# Patient Record
Sex: Female | Born: 1965
Health system: Southern US, Community
[De-identification: ages and names within clinical notes are randomized; demographics above are authoritative.]

## PROBLEM LIST (undated history)

## (undated) DIAGNOSIS — I10 Essential (primary) hypertension: Secondary | ICD-10-CM

## (undated) DIAGNOSIS — G43909 Migraine, unspecified, not intractable, without status migrainosus: Secondary | ICD-10-CM

## (undated) DIAGNOSIS — R011 Cardiac murmur, unspecified: Secondary | ICD-10-CM

## (undated) DIAGNOSIS — D649 Anemia, unspecified: Secondary | ICD-10-CM

## (undated) DIAGNOSIS — R51 Headache: Secondary | ICD-10-CM

## (undated) DIAGNOSIS — M1990 Unspecified osteoarthritis, unspecified site: Secondary | ICD-10-CM

## (undated) DIAGNOSIS — J45909 Unspecified asthma, uncomplicated: Secondary | ICD-10-CM

## (undated) DIAGNOSIS — T7840XA Allergy, unspecified, initial encounter: Secondary | ICD-10-CM

## (undated) HISTORY — DX: Headache: R51

## (undated) HISTORY — DX: Anemia, unspecified: D64.9

## (undated) HISTORY — DX: Unspecified osteoarthritis, unspecified site: M19.90

## (undated) HISTORY — DX: Allergy, unspecified, initial encounter: T78.40XA

## (undated) HISTORY — DX: Unspecified asthma, uncomplicated: J45.909

## (undated) HISTORY — DX: Essential (primary) hypertension: I10

## (undated) HISTORY — DX: Migraine, unspecified, not intractable, without status migrainosus: G43.909

## (undated) HISTORY — DX: Cardiac murmur, unspecified: R01.1

---

## 2001-11-10 ENCOUNTER — Encounter: Payer: Self-pay | Admitting: Family Medicine

## 2005-10-31 ENCOUNTER — Encounter: Payer: Self-pay | Admitting: Family Medicine

## 2007-06-19 ENCOUNTER — Ambulatory Visit: Payer: Self-pay | Admitting: Family Medicine

## 2007-06-19 DIAGNOSIS — G43009 Migraine without aura, not intractable, without status migrainosus: Secondary | ICD-10-CM | POA: Insufficient documentation

## 2007-06-19 DIAGNOSIS — R011 Cardiac murmur, unspecified: Secondary | ICD-10-CM

## 2007-07-24 ENCOUNTER — Encounter: Admission: RE | Admit: 2007-07-24 | Discharge: 2007-07-24 | Payer: Self-pay | Admitting: Family Medicine

## 2007-08-13 ENCOUNTER — Ambulatory Visit: Payer: Self-pay | Admitting: Family Medicine

## 2007-08-13 LAB — CONVERTED CEMR LAB
ALT: 16 units/L (ref 0–35)
AST: 17 units/L (ref 0–37)
Basophils Relative: 0.7 % (ref 0.0–1.0)
Bilirubin, Direct: 0.2 mg/dL (ref 0.0–0.3)
CO2: 28 meq/L (ref 19–32)
Calcium: 9 mg/dL (ref 8.4–10.5)
Chloride: 107 meq/L (ref 96–112)
Eosinophils Absolute: 0.2 10*3/uL (ref 0.0–0.6)
Eosinophils Relative: 3.6 % (ref 0.0–5.0)
GFR calc Af Amer: 89 mL/min
Glucose, Bld: 96 mg/dL (ref 70–99)
Glucose, Urine, Semiquant: NEGATIVE
HCT: 34.2 % — ABNORMAL LOW (ref 36.0–46.0)
Lymphocytes Relative: 51.5 % — ABNORMAL HIGH (ref 12.0–46.0)
MCV: 90.9 fL (ref 78.0–100.0)
Neutro Abs: 1.9 10*3/uL (ref 1.4–7.7)
Neutrophils Relative %: 35.5 % — ABNORMAL LOW (ref 43.0–77.0)
Nitrite: NEGATIVE
Platelets: 309 10*3/uL (ref 150–400)
Sodium: 139 meq/L (ref 135–145)
Specific Gravity, Urine: 1.02
TSH: 1.01 microintl units/mL (ref 0.35–5.50)
Total Protein: 6.2 g/dL (ref 6.0–8.3)
Triglycerides: 35 mg/dL (ref 0–149)
VLDL: 7 mg/dL (ref 0–40)
WBC Urine, dipstick: NEGATIVE
WBC: 5.3 10*3/uL (ref 4.5–10.5)
pH: 8

## 2007-08-24 ENCOUNTER — Encounter: Payer: Self-pay | Admitting: Family Medicine

## 2007-08-28 ENCOUNTER — Ambulatory Visit: Payer: Self-pay | Admitting: Family Medicine

## 2007-08-29 DIAGNOSIS — R51 Headache: Secondary | ICD-10-CM

## 2007-08-29 DIAGNOSIS — R519 Headache, unspecified: Secondary | ICD-10-CM | POA: Insufficient documentation

## 2007-09-14 ENCOUNTER — Other Ambulatory Visit: Admission: RE | Admit: 2007-09-14 | Discharge: 2007-09-14 | Payer: Self-pay | Admitting: Family Medicine

## 2007-09-14 ENCOUNTER — Ambulatory Visit: Payer: Self-pay | Admitting: Family Medicine

## 2007-09-14 ENCOUNTER — Encounter: Payer: Self-pay | Admitting: Family Medicine

## 2007-09-30 ENCOUNTER — Telehealth: Payer: Self-pay | Admitting: Family Medicine

## 2007-09-30 ENCOUNTER — Ambulatory Visit: Payer: Self-pay | Admitting: Internal Medicine

## 2007-12-31 ENCOUNTER — Ambulatory Visit: Payer: Self-pay | Admitting: Family Medicine

## 2007-12-31 LAB — CONVERTED CEMR LAB
Basophils Absolute: 0.1 10*3/uL (ref 0.0–0.1)
Basophils Relative: 1 % (ref 0.0–1.0)
MCHC: 33.4 g/dL (ref 30.0–36.0)
Monocytes Relative: 7.5 % (ref 3.0–11.0)
RBC: 3.91 M/uL (ref 3.87–5.11)
RDW: 12.1 % (ref 11.5–14.6)

## 2008-07-25 ENCOUNTER — Encounter: Admission: RE | Admit: 2008-07-25 | Discharge: 2008-07-25 | Payer: Self-pay | Admitting: Family Medicine

## 2008-08-08 ENCOUNTER — Ambulatory Visit: Payer: Self-pay | Admitting: Family Medicine

## 2008-08-08 LAB — CONVERTED CEMR LAB
AST: 17 units/L (ref 0–37)
Basophils Absolute: 0 10*3/uL (ref 0.0–0.1)
Basophils Relative: 0.6 % (ref 0.0–3.0)
Bilirubin, Direct: 0.2 mg/dL (ref 0.0–0.3)
Chloride: 104 meq/L (ref 96–112)
Cholesterol: 188 mg/dL (ref 0–200)
Creatinine, Ser: 0.9 mg/dL (ref 0.4–1.2)
Eosinophils Absolute: 0.3 10*3/uL (ref 0.0–0.7)
GFR calc non Af Amer: 73 mL/min
HDL: 61.2 mg/dL (ref >39.0)
LDL Cholesterol: 117 mg/dL — ABNORMAL HIGH (ref 0–99)
MCHC: 34.1 g/dL (ref 30.0–36.0)
MCV: 94.1 fL (ref 78.0–100.0)
Neutrophils Relative %: 35.7 % — ABNORMAL LOW (ref 43.0–77.0)
Platelets: 294 10*3/uL (ref 150–400)
RBC: 4.04 M/uL (ref 3.87–5.11)
RDW: 12.3 % (ref 11.5–14.6)
Sodium: 142 meq/L (ref 135–145)
TSH: 2.11 microintl units/mL (ref 0.35–5.50)
Total Bilirubin: 1.6 mg/dL — ABNORMAL HIGH (ref 0.3–1.2)
Triglycerides: 51 mg/dL (ref 0–149)
VLDL: 10 mg/dL (ref 0–40)

## 2008-08-23 ENCOUNTER — Telehealth: Payer: Self-pay | Admitting: Family Medicine

## 2008-08-23 ENCOUNTER — Other Ambulatory Visit: Admission: RE | Admit: 2008-08-23 | Discharge: 2008-08-23 | Payer: Self-pay | Admitting: Family Medicine

## 2008-08-23 ENCOUNTER — Encounter: Payer: Self-pay | Admitting: Family Medicine

## 2008-08-23 ENCOUNTER — Ambulatory Visit: Payer: Self-pay | Admitting: Family Medicine

## 2009-07-31 ENCOUNTER — Encounter: Admission: RE | Admit: 2009-07-31 | Discharge: 2009-07-31 | Payer: Self-pay | Admitting: Family Medicine

## 2009-08-14 ENCOUNTER — Ambulatory Visit: Payer: Self-pay | Admitting: Family Medicine

## 2009-08-14 LAB — CONVERTED CEMR LAB
Albumin: 3.9 g/dL (ref 3.5–5.2)
Alkaline Phosphatase: 39 units/L (ref 39–117)
Basophils Absolute: 0 10*3/uL (ref 0.0–0.1)
Bilirubin, Direct: 0.1 mg/dL (ref 0.0–0.3)
CO2: 28 meq/L (ref 19–32)
Calcium: 8.7 mg/dL (ref 8.4–10.5)
Creatinine, Ser: 0.8 mg/dL (ref 0.4–1.2)
Eosinophils Absolute: 0.2 10*3/uL (ref 0.0–0.7)
Glucose, Bld: 91 mg/dL (ref 70–99)
Glucose, Urine, Semiquant: NEGATIVE
HDL: 62.4 mg/dL (ref >39.00)
Ketones, urine, test strip: NEGATIVE
Lymphocytes Relative: 54.3 % — ABNORMAL HIGH (ref 12.0–46.0)
MCHC: 34.2 g/dL (ref 30.0–36.0)
Neutro Abs: 1.9 10*3/uL (ref 1.4–7.7)
Neutrophils Relative %: 35.3 % — ABNORMAL LOW (ref 43.0–77.0)
Nitrite: NEGATIVE
RDW: 12.4 % (ref 11.5–14.6)
Specific Gravity, Urine: 1.015
Triglycerides: 39 mg/dL (ref 0.0–149.0)
pH: 7.5

## 2009-09-04 ENCOUNTER — Encounter: Payer: Self-pay | Admitting: Family Medicine

## 2009-09-04 ENCOUNTER — Ambulatory Visit: Payer: Self-pay | Admitting: Family Medicine

## 2009-09-04 ENCOUNTER — Other Ambulatory Visit: Admission: RE | Admit: 2009-09-04 | Discharge: 2009-09-04 | Payer: Self-pay | Admitting: Family Medicine

## 2010-08-20 ENCOUNTER — Ambulatory Visit: Payer: Self-pay | Admitting: Family Medicine

## 2010-08-20 ENCOUNTER — Encounter: Admission: RE | Admit: 2010-08-20 | Discharge: 2010-08-20 | Payer: Self-pay | Admitting: Family Medicine

## 2010-08-20 LAB — CONVERTED CEMR LAB
BUN: 14 mg/dL (ref 6–23)
Basophils Absolute: 0.1 10*3/uL (ref 0.0–0.1)
Bilirubin Urine: NEGATIVE
Bilirubin, Direct: 0.1 mg/dL (ref 0.0–0.3)
CO2: 29 meq/L (ref 19–32)
Chloride: 106 meq/L (ref 96–112)
Cholesterol: 172 mg/dL (ref 0–200)
Creatinine, Ser: 0.8 mg/dL (ref 0.4–1.2)
Eosinophils Absolute: 0.2 10*3/uL (ref 0.0–0.7)
Glucose, Bld: 98 mg/dL (ref 70–99)
Ketones, urine, test strip: NEGATIVE
LDL Cholesterol: 102 mg/dL — ABNORMAL HIGH (ref 0–99)
Lymphs Abs: 2.8 10*3/uL (ref 0.7–4.0)
MCHC: 33.8 g/dL (ref 30.0–36.0)
MCV: 94.8 fL (ref 78.0–100.0)
Monocytes Absolute: 0.4 10*3/uL (ref 0.1–1.0)
Neutrophils Relative %: 32.5 % — ABNORMAL LOW (ref 43.0–77.0)
Platelets: 281 10*3/uL (ref 150.0–400.0)
RDW: 12.8 % (ref 11.5–14.6)
TSH: 1.65 microintl units/mL (ref 0.35–5.50)
Total Bilirubin: 0.8 mg/dL (ref 0.3–1.2)
Triglycerides: 37 mg/dL (ref 0.0–149.0)
Urobilinogen, UA: 0.2
WBC: 5.1 10*3/uL (ref 4.5–10.5)
pH: 6.5

## 2010-09-21 ENCOUNTER — Ambulatory Visit: Payer: Self-pay | Admitting: Family Medicine

## 2010-09-21 ENCOUNTER — Other Ambulatory Visit: Admission: RE | Admit: 2010-09-21 | Discharge: 2010-09-21 | Payer: Self-pay | Admitting: Family Medicine

## 2010-09-21 DIAGNOSIS — R319 Hematuria, unspecified: Secondary | ICD-10-CM

## 2010-09-21 LAB — CONVERTED CEMR LAB: Pap Smear: NEGATIVE

## 2010-10-23 ENCOUNTER — Ambulatory Visit: Payer: Self-pay | Admitting: Family Medicine

## 2010-10-23 ENCOUNTER — Telehealth: Payer: Self-pay | Admitting: Family Medicine

## 2010-10-23 LAB — CONVERTED CEMR LAB
Glucose, Urine, Semiquant: NEGATIVE
Ketones, urine, test strip: NEGATIVE
Specific Gravity, Urine: 1.01
pH: 7

## 2010-12-05 NOTE — Assessment & Plan Note (Signed)
Summary: cpx/pap/njr pt rsc/njr rsc bmp/njr ok per doc/njr   Vital Signs:  Patient profile:   45 year old female Menstrual status:  regular LMP:     09/05/2010 Height:      63 inches Weight:      182 pounds BMI:     32.36 Temp:     99.1 degrees F oral BP sitting:   120 / 90  (left arm) Cuff size:   regular  Vitals Entered By: Kern Reap CMA Duncan Dull) (September 21, 2010 10:07 AM) CC: cpx Is Patient Diabetic? No LMP (date): 09/05/2010     Enter LMP: 09/05/2010 Last PAP Result NEGATIVE FOR INTRAEPITHELIAL LESIONS OR MALIGNANCY.   CC:  cpx.  History of Present Illness: Tiffany Beltran is a 45 year old single female, nonsmoker, who comes in today for general physical examination  She has a history of underlying migraine headaches for which she uses Maxalt p.r.n. as time goes on.  The headaches become less frequent and less severe.  She also uses albuterol one puff or two puffs prior to exercise because of a history of exercise-induced asthma.  Periods regular and heavy currently not sexually active, some soreness in the right upper quadrant, sharp pain it hurts when she twists or moves x 3 days.  Otherwise well.  She takes an 81-mg baby aspirin daily.  She gets routine eye care, dental care, BSE monthly, annual mammography, history of fibrocystic changes in both breasts.  She declines a flu shot tetanus booster 2005  Allergies (verified): No Known Drug Allergies  Past History:  Past medical, surgical, family and social histories (including risk factors) reviewed, and no changes noted (except as noted below).  Past Medical History: Reviewed history from 08/23/2008 and no changes required. Headache MR and AR documented by echocardiogram.  Just barely audible murmurs.  No changes since previous exam.  Two years ago  Family History: Reviewed history from 08/23/2008 and no changes required. 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 No apparent alopecia or balding. Eyes:  No corneal or conjunctival inflammation noted. EOMI. Perrla. Funduscopic exam benign, without hemorrhages, exudates or papilledema. Vision grossly normal. Ears:  External ear exam shows no significant lesions or deformities.  Otoscopic examination reveals clear canals, tympanic membranes are intact bilaterally without bulging, retraction, inflammation or discharge. Hearing is grossly normal bilaterally. Nose:  External nasal examination shows no deformity or inflammation. Nasal mucosa are pink and moist without lesions or exudates. Mouth:  Oral mucosa and oropharynx without lesions or exudates.  Teeth in good repair. Neck:  No deformities, masses, or tenderness noted. Chest Wall:  No deformities, masses, or tenderness noted. Breasts:  No mass, nodules, thickening, tenderness, bulging, retraction, inflamation, nipple discharge or skin changes noted.   Lungs:  Normal respiratory effort, chest expands symmetrically. Lungs are clear to auscultation, no crackles or wheezes. Heart:  Normal rate and regular rhythm. S1 and S2 normal without gallop, murmur, click, rub or other extra sounds. Abdomen:  Bowel sounds positive,abdomen soft and non-tender without masses, organomegaly or hernias noted. Rectal:  No external abnormalities noted. Normal sphincter tone. No rectal masses or tenderness. Genitalia:  Pelvic Exam:        External:  normal female genitalia without lesions or masses        Vagina: normal without lesions or masses        Cervix: normal without lesions or masses        Adnexa: normal bimanual  exam without masses or fullness        Uterus: normal by palpation        Pap smear: performed Msk:  No deformity or scoliosis noted of thoracic or lumbar spine.   Pulses:  R and L carotid,radial,femoral,dorsalis pedis and posterior tibial pulses are full and equal bilaterally Extremities:  No clubbing, cyanosis, edema, or deformity noted with normal full range of motion of all joints.   Neurologic:  No cranial nerve deficits noted. Station and gait are normal. Plantar reflexes are down-going bilaterally. DTRs are symmetrical throughout. Sensory, motor and coordinative functions appear intact. Skin:  Intact without suspicious lesions or rashes Cervical Nodes:  No lymphadenopathy noted Axillary Nodes:  No palpable lymphadenopathy Inguinal Nodes:  No significant adenopathy Psych:  Cognition and judgment appear intact. Alert and cooperative with normal attention span and concentration. No apparent delusions, illusions, hallucinations   Impression & Recommendations:  Problem # 1:  HEALTH SCREENING (ICD-V70.0) Assessment Unchanged  Orders: Prescription Created Electronically [phone])  Problem # 2:  HEADACHE (ICD-784.0) Assessment: Improved  Her updated medication list for this problem includes:    Maxalt-mlt 5 Mg Tbdp (Rizatriptan benzoate) ..... One stat for migraine    Aspirin 81 Mg Tabs (Aspirin) .Marland Kitchen... Take one tab by mouth once daily  Problem # 3:  ASTHMA NOS W/O STATUS ASTHMATICUS (ICD-493.90) Assessment: Improved  Her updated medication list for this problem includes:    Ventolin Hfa 108 (90 Base) Mcg/act Aers (Albuterol sulfate) .Marland Kitchen... 2 puffs three times a day prn  Orders: Prescription Created Electronically [phone])  Complete Medication List: 1)  Maxalt-mlt 5 Mg Tbdp (Rizatriptan benzoate) .... One stat for migraine 2)  Ventolin Hfa 108 (90 Base) Mcg/act Aers (Albuterol sulfate) .... 2 puffs three times a day prn 3)  Aspirin 81 Mg Tabs (Aspirin) .... Take one tab by mouth  once daily 4)  Daily Multiple Vitamins Tabs (Multiple vitamin) .... Once daily 5)  Septra Ds 800-160 Mg Tabs (Sulfamethoxazole-trimethoprim) .... Take 1 tablet by mouth two times a day  Other Orders: UA Dipstick w/o Micro (manual) (09811)  Patient Instructions: 1)  Please schedule a follow-up appointment in 1 year. 2)  It is important that you exercise regularly at least 20 minutes 5 times a week. If you develop chest pain, have severe difficulty breathing, or feel very tired , stop exercising immediately and seek medical attention. 3)  Take an Aspirin every day. 4)  Take 600 mg of Motrin twice daily with food, starting 5 days prior to you.  To help with the cramps and back pain 5)  Take Septra DS one twice daily for two weeks, return in 3 weeks to recheck your urine Prescriptions: SEPTRA DS 800-160 MG TABS (SULFAMETHOXAZOLE-TRIMETHOPRIM) Take 1 tablet by mouth two times a day  #30 x 0   Entered and Authorized by:   Roderick Pee MD   Signed by:   Roderick Pee MD on 09/21/2010   Method used:   Electronically to        [id number] (retail)       [address]       [place], [place]  [address]       Ph: [phone] or [phone]  Fax: [phone]   RxID:   [id number] VENTOLIN HFA 108 (90 BASE) MCG/ACT  AERS (ALBUTEROL SULFATE) 2 puffs three times a day prn  #2 x 1   Entered and Authorized by:   Roderick Pee MD   Signed by:   Roderick Pee MD on 09/21/2010   Method used:   Electronically to        [id number] (retail)       [address]       [place], [place]  [address]       Ph: [phone] or [phone]       Fax: [phone]   RxID:   [id number] MAXALT-MLT 5 MG  TBDP (RIZATRIPTAN BENZOATE) one stat for migraine  #6 x 11   Entered and Authorized by:   Roderick Pee MD   Signed by:   Roderick Pee MD on 09/21/2010   Method used:   Electronically to        [id number] (retail)       [address]       [place], [place]  [address]        Ph: [phone] or [phone]       Fax: [phone]   RxID:   [id number]    Orders Added: 1)  Prescription Created Electronically [G8553] 2)  Est. Patient 40-64 years [99396] 3)  UA Dipstick w/o Micro (manual) [81002] 4)  Est. Patient Level III [27035]   Immunization History:  Tetanus/Td Immunization History:    Tetanus/Td:  historical (11/05/2003)   Immunization History:  Tetanus/Td Immunization History:    Tetanus/Td:  Historical (11/05/2003)

## 2010-12-06 NOTE — Assessment & Plan Note (Signed)
Summary: 3 wk rov/njr/pt rsc/cjr   Vital Signs:  Patient profile:   45 year old female Menstrual status:  regular Weight:      184 pounds Temp:     98.3 degrees F oral BP sitting:   122 / 80  (left arm) Cuff size:   regular  Vitals Entered By: Kern Reap CMA Duncan Dull) (October 23, 2010 9:50 AM) CC: 3 wk rov---hematuria fu   CC:  3 wk rov---hematuria fu.  History of Present Illness: Tiffany Beltran is a 45 year old female, who comes in today for reevaluation of hematuria.  We saw her for her general physical examination, it which time routine urinalysis showed hematuria.  We gave her Septra DS x 2 weeks, which she took.  No side effects from medication.  Urinalysis today shows the persistence of the large amount of hematuria.  We will send her for urologic eval  Allergies: No Known Drug Allergies  Past History:  Past medical, surgical, family and social histories (including risk factors) reviewed for relevance to current acute and chronic problems.  Past Medical History: Reviewed history from 08/23/2008 and no changes required. Headache MR and AR documented by echocardiogram.  Just barely audible murmurs.  No changes since previous exam.  Two years ago  Family History: Reviewed history from 08/23/2008 and no changes required. 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 Take 1 tablet by mouth two times a  day  Orders: Urinalysis-dipstick only (Medicare patient) ([id number])  Complete Medication List: 1)  Maxalt-mlt 5 Mg Tbdp (Rizatriptan benzoate) .... One stat for migraine 2)  Ventolin Hfa 108 (90 Base) Mcg/act Aers (Albuterol sulfate) .... 2 puffs three times a day prn 3)  Aspirin 81 Mg Tabs (Aspirin) .... Take one tab by mouth once daily 4)  Daily Multiple Vitamins Tabs (Multiple vitamin) .... Once daily 5)  Septra Ds 800-160 Mg Tabs (Sulfamethoxazole-trimethoprim) .... Take 1 tablet by mouth two times a day  Patient Instructions: 1)  call the urology Center and make an appointment to see Dr. Charlyn Minerva for further evaluation of the hematuria   Orders Added: 1)  Urinalysis-dipstick only (Medicare patient) [[id number]] 2)  Est. Patient Level III [99213]    Laboratory Results   Urine Tests  Date/Time Received: October 23, 2010 10:03 AM Date/Time Reported: October 23, 2010 10:03 AM  Routine Urinalysis   Color: yellow Appearance: Clear Glucose: negative   (Normal Range: Negative) Bilirubin: moderate   (Normal Range: Negative) Ketone: negative   (Normal Range: Negative) Spec. Gravity: 1.010   (Normal Range: 1.003-1.035) Blood: large   (Normal Range: Negative) pH: 7.0   (Normal Range: 5.0-8.0) Protein: trace   (Normal Range: Negative) Urobilinogen: 0.2   (Normal Range: 0-1) Nitrite: negative   (Normal Range: Negative) Leukocyte Esterace: negative   (Normal Range: Negative)

## 2010-12-06 NOTE — Progress Notes (Signed)
Summary: fax labs  Phone Note Call from Patient Call back at Home Phone [phone] Call back at Work Phone [phone]   Summary of Call: was seen this morning and she made an appt with Dr Vernie Ammons @ [facility]. Needs urine lab results faxed to them.  Follow-up for Phone Call        faxed Follow-up by: Kern Reap CMA Duncan Dull),  October 24, 2010 12:25 PM

## 2011-08-01 ENCOUNTER — Other Ambulatory Visit: Payer: Self-pay | Admitting: Family Medicine

## 2011-08-01 DIAGNOSIS — Z1231 Encounter for screening mammogram for malignant neoplasm of breast: Secondary | ICD-10-CM

## 2011-08-27 ENCOUNTER — Ambulatory Visit: Payer: Self-pay

## 2011-08-27 ENCOUNTER — Ambulatory Visit
Admission: RE | Admit: 2011-08-27 | Discharge: 2011-08-27 | Disposition: A | Discharge status: Home / Self Care | Payer: Private Health Insurance - Indemnity | Source: Ambulatory Visit | Attending: Family Medicine | Admitting: Family Medicine

## 2011-08-27 DIAGNOSIS — Z1231 Encounter for screening mammogram for malignant neoplasm of breast: Secondary | ICD-10-CM

## 2011-10-02 ENCOUNTER — Other Ambulatory Visit (INDEPENDENT_AMBULATORY_CARE_PROVIDER_SITE_OTHER): Payer: Private Health Insurance - Indemnity

## 2011-10-02 DIAGNOSIS — Z0000 Encounter for general adult medical examination without abnormal findings: Secondary | ICD-10-CM

## 2011-10-02 LAB — POCT URINALYSIS DIPSTICK
Glucose, UA: NEGATIVE
Leukocytes, UA: NEGATIVE
Nitrite, UA: NEGATIVE
Spec Grav, UA: 1.02
Urobilinogen, UA: 1

## 2011-10-02 LAB — BASIC METABOLIC PANEL
Chloride: 106 mEq/L (ref 96–112)
Creatinine, Ser: 0.9 mg/dL (ref 0.4–1.2)
GFR: 89.49 mL/min (ref >60.00)
Potassium: 4.1 mEq/L (ref 3.5–5.1)

## 2011-10-02 LAB — CBC WITH DIFFERENTIAL/PLATELET
Basophils Relative: 0.7 % (ref 0.0–3.0)
Eosinophils Relative: 2.6 % (ref 0.0–5.0)
MCV: 94.8 fl (ref 78.0–100.0)
Monocytes Absolute: 0.3 10*3/uL (ref 0.1–1.0)
Monocytes Relative: 5.8 % (ref 3.0–12.0)
Neutrophils Relative %: 42 % — ABNORMAL LOW (ref 43.0–77.0)
Platelets: 292 10*3/uL (ref 150.0–400.0)
RBC: 3.91 Mil/uL (ref 3.87–5.11)
WBC: 5.1 10*3/uL (ref 4.5–10.5)

## 2011-10-02 LAB — LIPID PANEL
Cholesterol: 180 mg/dL (ref 0–200)
LDL Cholesterol: 109 mg/dL — ABNORMAL HIGH (ref 0–99)
Total CHOL/HDL Ratio: 3
VLDL: 9 mg/dL (ref 0.0–40.0)

## 2011-10-02 LAB — HEPATIC FUNCTION PANEL
ALT: 12 U/L (ref 0–35)
AST: 16 U/L (ref 0–37)
Alkaline Phosphatase: 44 U/L (ref 39–117)
Bilirubin, Direct: 0.1 mg/dL (ref 0.0–0.3)
Total Bilirubin: 0.9 mg/dL (ref 0.3–1.2)
Total Protein: 7 g/dL (ref 6.0–8.3)

## 2011-10-14 ENCOUNTER — Encounter: Payer: Self-pay | Admitting: Family Medicine

## 2011-10-22 ENCOUNTER — Other Ambulatory Visit (HOSPITAL_COMMUNITY)
Admission: RE | Admit: 2011-10-22 | Discharge: 2011-10-22 | Disposition: A | Discharge status: Home / Self Care | Payer: Private Health Insurance - Indemnity | Source: Ambulatory Visit | Attending: Family Medicine | Admitting: Family Medicine

## 2011-10-22 ENCOUNTER — Ambulatory Visit (INDEPENDENT_AMBULATORY_CARE_PROVIDER_SITE_OTHER): Payer: Private Health Insurance - Indemnity | Admitting: Family Medicine

## 2011-10-22 ENCOUNTER — Encounter: Payer: Self-pay | Admitting: Family Medicine

## 2011-10-22 DIAGNOSIS — Z01419 Encounter for gynecological examination (general) (routine) without abnormal findings: Secondary | ICD-10-CM | POA: Insufficient documentation

## 2011-10-22 DIAGNOSIS — J45909 Unspecified asthma, uncomplicated: Secondary | ICD-10-CM

## 2011-10-22 DIAGNOSIS — R319 Hematuria, unspecified: Secondary | ICD-10-CM

## 2011-10-22 DIAGNOSIS — G43009 Migraine without aura, not intractable, without status migrainosus: Secondary | ICD-10-CM

## 2011-10-22 DIAGNOSIS — Z0000 Encounter for general adult medical examination without abnormal findings: Secondary | ICD-10-CM

## 2011-10-22 MED ORDER — RIZATRIPTAN BENZOATE 5 MG PO TABS: 5.0000 mg | ORAL_TABLET | ORAL | Status: DC | PRN

## 2011-10-22 MED ORDER — ALBUTEROL SULFATE HFA 108 (90 BASE) MCG/ACT IN AERS: INHALATION_SPRAY | RESPIRATORY_TRACT | Status: DC

## 2011-10-22 NOTE — Progress Notes (Signed)
  Subjective:    Patient ID: Tiffany Beltran, female    DOB: 05/27/1966, 45 y.o.   MRN: [id number]  HPI Tiffany Beltran is a 45 year old single female, nonsmoker, who comes in today for general physical examination,  She's always been in excellent, health.  She's had no chronic health problems.  She does have some exercise-induced asthma for which he takes albuterol before she runs.  She runs about 44 to 20 miles per week.  She also uses Maxalt for migraines.  However, her deep migraines to diminish this year showing at 3 in the past 12 months.  LMP December the 11th of July lasted 3 days.  Her cycle is getting shorter.  She's also having some mini hot flushes   Review of Systems  Constitutional: Negative.   HENT: Negative.   Eyes: Negative.   Respiratory: Negative.   Cardiovascular: Negative.   Gastrointestinal: Negative.   Genitourinary: Negative.   Musculoskeletal: Negative.   Neurological: Negative.   Hematological: Negative.   Psychiatric/Behavioral: Negative.        Objective:   Physical Exam  Constitutional: She appears well-developed and well-nourished.  HENT:  Head: Normocephalic and atraumatic.  Right Ear: External ear normal.  Left Ear: External ear normal.  Nose: Nose normal.  Mouth/Throat: Oropharynx is clear and moist.  Eyes: EOM are normal. Pupils are equal, round, and reactive to light.  Neck: Normal range of motion. Neck supple. No thyromegaly present.  Cardiovascular: Normal rate, regular rhythm, normal heart sounds and intact distal pulses.  Exam reveals no gallop and no friction rub.   No murmur heard. Pulmonary/Chest: Effort normal and breath sounds normal.  Abdominal: Soft. Bowel sounds are normal. She exhibits no distension and no mass. There is no tenderness. There is no rebound.  Genitourinary: Vagina normal and uterus normal. Guaiac negative stool. No vaginal discharge found.  Musculoskeletal: Normal range of motion.  Lymphadenopathy:    She has no cervical  adenopathy.  Neurological: She is alert. She has normal reflexes. No cranial nerve deficit. She exhibits normal muscle tone. Coordination normal.  Skin: Skin is warm and dry.  Psychiatric: She has a normal mood and affect. Her behavior is normal. Judgment and thought content normal.          Assessment & Plan:  Healthy female.  Exercise-induced asthma.  Albuterol prior to running.  Migraine headaches Maxalt p.r.n.  Return in one year, sooner if any problems.  Tetanus booster 2005.  She declines a flu shot

## 2011-10-22 NOTE — Patient Instructions (Signed)
Continue your good health habits and return in one year, sooner if any problems.  Remember to do a thorough breast exam monthly and get her mammogram every fall.  Albuterol one puff 30 minutes prior to exercise.  Maxalt p.r.n. For migraine

## 2012-07-01 ENCOUNTER — Other Ambulatory Visit: Payer: Self-pay | Admitting: Family Medicine

## 2012-07-01 DIAGNOSIS — Z1231 Encounter for screening mammogram for malignant neoplasm of breast: Secondary | ICD-10-CM

## 2012-08-27 ENCOUNTER — Ambulatory Visit: Payer: Private Health Insurance - Indemnity

## 2012-08-31 ENCOUNTER — Ambulatory Visit: Payer: Private Health Insurance - Indemnity

## 2012-09-07 ENCOUNTER — Ambulatory Visit: Payer: Private Health Insurance - Indemnity

## 2012-10-02 ENCOUNTER — Ambulatory Visit
Admission: RE | Admit: 2012-10-02 | Discharge: 2012-10-02 | Disposition: A | Discharge status: Home / Self Care | Payer: Private Health Insurance - Indemnity | Source: Ambulatory Visit | Attending: Family Medicine | Admitting: Family Medicine

## 2012-10-02 ENCOUNTER — Other Ambulatory Visit (INDEPENDENT_AMBULATORY_CARE_PROVIDER_SITE_OTHER): Payer: Private Health Insurance - Indemnity

## 2012-10-02 DIAGNOSIS — Z1231 Encounter for screening mammogram for malignant neoplasm of breast: Secondary | ICD-10-CM

## 2012-10-02 DIAGNOSIS — Z0000 Encounter for general adult medical examination without abnormal findings: Secondary | ICD-10-CM

## 2012-10-02 LAB — CBC WITH DIFFERENTIAL/PLATELET
Basophils Absolute: 0 10*3/uL (ref 0.0–0.1)
Eosinophils Relative: 3.5 % (ref 0.0–5.0)
HCT: 39.2 % (ref 36.0–46.0)
Hemoglobin: 12.9 g/dL (ref 12.0–15.0)
Lymphocytes Relative: 49.8 % — ABNORMAL HIGH (ref 12.0–46.0)
Monocytes Relative: 4.4 % (ref 3.0–12.0)
Neutro Abs: 2.6 10*3/uL (ref 1.4–7.7)
RDW: 13.1 % (ref 11.5–14.6)
WBC: 6.2 10*3/uL (ref 4.5–10.5)

## 2012-10-02 LAB — BASIC METABOLIC PANEL
Calcium: 9.3 mg/dL (ref 8.4–10.5)
GFR: 100.9 mL/min (ref >60.00)
Glucose, Bld: 98 mg/dL (ref 70–99)
Potassium: 3.8 mEq/L (ref 3.5–5.1)
Sodium: 138 mEq/L (ref 135–145)

## 2012-10-02 LAB — HEPATIC FUNCTION PANEL
ALT: 13 U/L (ref 0–35)
AST: 16 U/L (ref 0–37)
Albumin: 3.9 g/dL (ref 3.5–5.2)
Alkaline Phosphatase: 47 U/L (ref 39–117)
Total Bilirubin: 1 mg/dL (ref 0.3–1.2)

## 2012-10-02 LAB — POCT URINALYSIS DIPSTICK
Bilirubin, UA: NEGATIVE
Glucose, UA: NEGATIVE
Spec Grav, UA: 1.02
pH, UA: 7

## 2012-10-02 LAB — LIPID PANEL
HDL: 56.4 mg/dL (ref >39.00)
LDL Cholesterol: 119 mg/dL — ABNORMAL HIGH (ref 0–99)
Total CHOL/HDL Ratio: 3
Triglycerides: 99 mg/dL (ref 0.0–149.0)
VLDL: 19.8 mg/dL (ref 0.0–40.0)

## 2012-10-05 ENCOUNTER — Other Ambulatory Visit: Payer: Private Health Insurance - Indemnity

## 2012-10-20 ENCOUNTER — Telehealth: Payer: Self-pay | Admitting: Family Medicine

## 2012-10-20 NOTE — Telephone Encounter (Signed)
Inform the patient that she should come in for her physical on Thursday and we can reschedule the pap if needed at no cost to her.

## 2012-10-20 NOTE — Telephone Encounter (Signed)
Done

## 2012-10-20 NOTE — Telephone Encounter (Signed)
Pt is having her period this week and has CPX on thurs. She wants to have her physical and wants to know if we can get separate appt for PAP. Pls call pt or advise.

## 2012-10-22 ENCOUNTER — Ambulatory Visit (INDEPENDENT_AMBULATORY_CARE_PROVIDER_SITE_OTHER): Payer: Private Health Insurance - Indemnity | Admitting: Family Medicine

## 2012-10-22 ENCOUNTER — Encounter: Payer: Self-pay | Admitting: Family Medicine

## 2012-10-22 VITALS — BP 124/80 | Temp 98.5°F | Ht 63.25 in | Wt 191.0 lb

## 2012-10-22 DIAGNOSIS — R319 Hematuria, unspecified: Secondary | ICD-10-CM

## 2012-10-22 DIAGNOSIS — J45909 Unspecified asthma, uncomplicated: Secondary | ICD-10-CM

## 2012-10-22 DIAGNOSIS — G43009 Migraine without aura, not intractable, without status migrainosus: Secondary | ICD-10-CM

## 2012-10-22 MED ORDER — RIZATRIPTAN BENZOATE 5 MG PO TABS: 5.0000 mg | ORAL_TABLET | ORAL | Status: DC | PRN

## 2012-10-22 NOTE — Progress Notes (Signed)
  Subjective:    Patient ID: Tiffany Beltran, female    DOB: Nov 03, 1966, 46 y.o.   MRN: [id number]  HPI Tiffany Beltran is a 46 year old single female nonsmoker who comes in today for general physical examination  She takes Maxalt when necessary for migraine headaches. She's had more migraines is here because of stress. She's working full-time and going to school at [employer] to get a masters in education  She gets routine eye care, dental care, BSE monthly, mammogram this past fall normal, tetanus 2005, declined a flu shot. LMP started yesterday she declines a Pap  She is single not sexually active at this juncture.   Review of Systems  Constitutional: Negative.   HENT: Negative.   Eyes: Negative.   Respiratory: Negative.   Cardiovascular: Negative.   Gastrointestinal: Negative.   Genitourinary: Negative.   Musculoskeletal: Negative.   Neurological: Negative.   Hematological: Negative.   Psychiatric/Behavioral: Negative.        Objective:   Physical Exam  Constitutional: She appears well-developed and well-nourished.  HENT:  Head: Normocephalic and atraumatic.  Right Ear: External ear normal.  Left Ear: External ear normal.  Nose: Nose normal.  Mouth/Throat: Oropharynx is clear and moist.  Eyes: EOM are normal. Pupils are equal, round, and reactive to light.  Neck: Normal range of motion. Neck supple. No thyromegaly present.  Cardiovascular: Normal rate, regular rhythm, normal heart sounds and intact distal pulses.  Exam reveals no gallop and no friction rub.   No murmur heard. Pulmonary/Chest: Effort normal and breath sounds normal.  Abdominal: Soft. Bowel sounds are normal. She exhibits no distension and no mass. There is no tenderness. There is no rebound.  Genitourinary:       Bilateral breast exam shows multiple fibrocystic changes throughout both breasts.  Musculoskeletal: Normal range of motion.  Lymphadenopathy:    She has no cervical adenopathy.  Neurological: She is  alert. She has normal reflexes. No cranial nerve deficit. She exhibits normal muscle tone. Coordination normal.  Skin: Skin is warm and dry.  Psychiatric: She has a normal mood and affect. Her behavior is normal. Judgment and thought content normal.          Assessment & Plan:  Healthy female  Migraine headaches continue Maxalt when necessary  Exercise-induced asthma continue albuterol when necessary  Fibrocystic breast changes encouraged BSE monthly and continue annual mammography.

## 2012-10-22 NOTE — Patient Instructions (Signed)
Continue the Maxalt when necessary however if you notice and increase frequency and/or severity the migraines we can then discuss prophylactic medication  Use the albuterol when necessary  Followup in 1 year sooner if any problems  Continue to do BSE monthly

## 2012-11-16 ENCOUNTER — Ambulatory Visit: Payer: Private Health Insurance - Indemnity | Admitting: Family Medicine

## 2012-11-23 ENCOUNTER — Other Ambulatory Visit (HOSPITAL_COMMUNITY)
Admission: RE | Admit: 2012-11-23 | Discharge: 2012-11-23 | Disposition: A | Discharge status: Home / Self Care | Payer: Private Health Insurance - Indemnity | Source: Ambulatory Visit | Attending: Family Medicine | Admitting: Family Medicine

## 2012-11-23 ENCOUNTER — Ambulatory Visit (INDEPENDENT_AMBULATORY_CARE_PROVIDER_SITE_OTHER): Payer: Private Health Insurance - Indemnity | Admitting: Family Medicine

## 2012-11-23 VITALS — Temp 98.6°F

## 2012-11-23 DIAGNOSIS — Z01419 Encounter for gynecological examination (general) (routine) without abnormal findings: Secondary | ICD-10-CM

## 2012-11-23 NOTE — Patient Instructions (Signed)
Return in one year for followup sooner if any problems

## 2012-11-23 NOTE — Progress Notes (Signed)
  Subjective:    Patient ID: Tiffany Beltran, female    DOB: July 25, 1966, 47 y.o.   MRN: [id number]  HPI Tiffany Beltran is a 47 year old female nonsmoker who comes in today for a Pap smear  We saw her last week for a physical examination however at the time she was having her period therefore her Pap smear was rescheduled today  No complaints    Review of Systems See previous note    Objective:   Physical Exam  Well-developed and nourished female no acute distress pelvic examination within normal limits Pap smear done bimanual exam normal rectal normal stool guaiac-negative      Assessment & Plan:

## 2013-02-22 ENCOUNTER — Telehealth: Payer: Self-pay | Admitting: Family Medicine

## 2013-02-22 NOTE — Telephone Encounter (Signed)
Patient Information:  Caller Name: Ayelen  Phone: [phone]  Patient: Tiffany Beltran, Tiffany Beltran  Gender: Female  DOB: 10-20-1966  Age: 47 Years  PCP: [facility])  Pregnant: No  Office Follow Up:  Does the office need to follow up with this patient?: No  Instructions For The Office: N/A  RN Note:  She is scheduled for next Monday in the office with Dr. Tawanna Cooler.  Symptoms  Reason For Call & Symptoms: Patient states she had an accident last week while in [place]. She dropped a computer tower on her left foot on Wednesday 02/17/13. She was seen in [facility] for swelling. She was given a boot , crutches and medication.  She did not take the medication because she was out of town.  She brought the X-ray back with her . She wanted to know should she continue to wear the boot?  Dx with "severe Contusion" she was given Light duty . Swelling has gone down but at site of injury is sore and tender. She is able to bear weight with occasional pain in arch of her foot. Pain with flexing of the foot.  Reviewed Health History In EMR: Yes  Reviewed Medications In EMR: Yes  Reviewed Allergies In EMR: Yes  Reviewed Surgeries / Procedures: Yes  Date of Onset of Symptoms: 02/17/2013  Treatments Tried: Boot to foot, Tylenol OTC  Treatments Tried Worked: No OB / GYN:  LMP: 02/01/2013  Guideline(s) Used:  Ankle and Foot Injury  Disposition Per Guideline:   Home Care  Reason For Disposition Reached:   Minor ankle or foot injury  Advice Given:  Apply Heat to the Area:  Beginning 48 hours after an injury, apply a warm washcloth or heating pad for 10 minutes three times a day.  This will help increase blood flow and improve healing.  Elevate the Ankle and Foot:  Lay down and put your ankle and foot on a pillow. This puts (elevates) the ankle and foot above the heart.  Do this for 15-20 minutes, 2-3 times a day, for the first two days.  This can also help decrease swelling, bruising, and pain.  Expected Course:  Pain, swelling, and bruising usually start to get better 2 to 3 days after an injury.  Swelling most often is gone after 1 week.  Bruises fade away slowly over 1-2 weeks.  It may take 2 weeks for pain and tenderness of the injured area to go away.  Call Back If:  Pain becomes severe  Pain does not improve after 3 days  Pain or swelling lasts more than 2 weeks  You become worse.  Patient Will Follow Care Advice:  YES

## 2013-02-25 ENCOUNTER — Ambulatory Visit: Payer: Self-pay | Admitting: Family Medicine

## 2013-03-01 ENCOUNTER — Ambulatory Visit: Payer: Private Health Insurance - Indemnity | Admitting: Family Medicine

## 2013-04-28 ENCOUNTER — Ambulatory Visit: Payer: Private Health Insurance - Indemnity | Admitting: Family Medicine

## 2013-08-12 ENCOUNTER — Other Ambulatory Visit: Payer: Self-pay

## 2013-08-12 DIAGNOSIS — Z1231 Encounter for screening mammogram for malignant neoplasm of breast: Secondary | ICD-10-CM

## 2013-10-11 ENCOUNTER — Ambulatory Visit
Admission: RE | Admit: 2013-10-11 | Discharge: 2013-10-11 | Disposition: A | Discharge status: Home / Self Care | Payer: 59 | Source: Ambulatory Visit

## 2013-10-11 DIAGNOSIS — Z1231 Encounter for screening mammogram for malignant neoplasm of breast: Secondary | ICD-10-CM

## 2013-11-01 ENCOUNTER — Other Ambulatory Visit (INDEPENDENT_AMBULATORY_CARE_PROVIDER_SITE_OTHER): Payer: 59

## 2013-11-01 DIAGNOSIS — Z0000 Encounter for general adult medical examination without abnormal findings: Secondary | ICD-10-CM

## 2013-11-01 LAB — CBC WITH DIFFERENTIAL/PLATELET
Basophils Relative: 0.6 % (ref 0.0–3.0)
Eosinophils Absolute: 0.2 10*3/uL (ref 0.0–0.7)
MCHC: 33.4 g/dL (ref 30.0–36.0)
MCV: 91.1 fl (ref 78.0–100.0)
Monocytes Absolute: 0.3 10*3/uL (ref 0.1–1.0)
Neutrophils Relative %: 47 % (ref 43.0–77.0)
Platelets: 333 10*3/uL (ref 150.0–400.0)
RBC: 3.81 Mil/uL — ABNORMAL LOW (ref 3.87–5.11)
RDW: 13.1 % (ref 11.5–14.6)

## 2013-11-01 LAB — POCT URINALYSIS DIPSTICK
Bilirubin, UA: NEGATIVE
Leukocytes, UA: NEGATIVE
Nitrite, UA: NEGATIVE
Urobilinogen, UA: 0.2
pH, UA: 7

## 2013-11-01 LAB — HEPATIC FUNCTION PANEL
ALT: 17 U/L (ref 0–35)
Albumin: 3.6 g/dL (ref 3.5–5.2)
Total Protein: 6.5 g/dL (ref 6.0–8.3)

## 2013-11-01 LAB — LIPID PANEL
HDL: 49.7 mg/dL (ref >39.00)
LDL Cholesterol: 123 mg/dL — ABNORMAL HIGH (ref 0–99)
Total CHOL/HDL Ratio: 4
Triglycerides: 89 mg/dL (ref 0.0–149.0)

## 2013-11-01 LAB — BASIC METABOLIC PANEL
CO2: 26 mEq/L (ref 19–32)
Calcium: 8.7 mg/dL (ref 8.4–10.5)
Chloride: 107 mEq/L (ref 96–112)
Creatinine, Ser: 0.8 mg/dL (ref 0.4–1.2)
Glucose, Bld: 94 mg/dL (ref 70–99)
Sodium: 140 mEq/L (ref 135–145)

## 2013-11-11 ENCOUNTER — Encounter: Payer: Self-pay | Admitting: Family Medicine

## 2013-11-11 ENCOUNTER — Other Ambulatory Visit (HOSPITAL_COMMUNITY)
Admission: RE | Admit: 2013-11-11 | Discharge: 2013-11-11 | Disposition: A | Discharge status: Home / Self Care | Payer: Managed Care, Other (non HMO) | Source: Ambulatory Visit | Attending: Family Medicine | Admitting: Family Medicine

## 2013-11-11 ENCOUNTER — Ambulatory Visit (INDEPENDENT_AMBULATORY_CARE_PROVIDER_SITE_OTHER): Payer: Managed Care, Other (non HMO) | Admitting: Family Medicine

## 2013-11-11 VITALS — BP 120/80 | Temp 99.0°F | Ht 64.0 in | Wt 206.0 lb

## 2013-11-11 DIAGNOSIS — G43009 Migraine without aura, not intractable, without status migrainosus: Secondary | ICD-10-CM

## 2013-11-11 DIAGNOSIS — R319 Hematuria, unspecified: Secondary | ICD-10-CM

## 2013-11-11 DIAGNOSIS — Z01419 Encounter for gynecological examination (general) (routine) without abnormal findings: Secondary | ICD-10-CM

## 2013-11-11 DIAGNOSIS — Z124 Encounter for screening for malignant neoplasm of cervix: Secondary | ICD-10-CM | POA: Insufficient documentation

## 2013-11-11 DIAGNOSIS — J45909 Unspecified asthma, uncomplicated: Secondary | ICD-10-CM

## 2013-11-11 NOTE — Patient Instructions (Signed)
Continue your current medications   ear wax kit.........Marland Kitchen every quarter......Marland Kitchen but one drop of ear wax dissolving drops in each ear canal bedtime then packed with cotton. In the morning remove the cotton  After 10 days of this flush with warm water  Return in one year for general physical exam sooner if any problems

## 2013-11-11 NOTE — Progress Notes (Signed)
   Subjective:    Patient ID: Tiffany Beltran, female    DOB: [birth year], 48 y.o.   MRN: [id number]  HPI Tiffany Beltran is a 48 year old female nonsmoker who comes in today for general physical examination because of a history of mild asthma however it's been very quiet he's not had to use her albuterol at all. She takes Maxalt 5 mg when necessary for migraines and her migraines have decreased in severity and frequency over the last 12 months.  She gets routine eye care, dental care, BSE monthly, and you mammography, colonoscopy would be recommended*50 since she has a negative family history of colon cancer and polyps. Vaccinations up-to-date she declines a flu shot.  LMP last month normal the last 3-5 days monthly no birth control she's currently not sexually active  She's going to school at [employer].Marland Kitchen on the computer..... for graduate work and working full-time   Review of Systems  Constitutional: Negative.   HENT: Negative.   Eyes: Negative.   Respiratory: Negative.   Cardiovascular: Negative.   Gastrointestinal: Negative.   Endocrine: Negative.   Genitourinary: Negative.   Musculoskeletal: Negative.   Allergic/Immunologic: Negative.   Neurological: Negative.   Hematological: Negative.   Psychiatric/Behavioral: Negative.        Objective:   Physical Exam  Nursing note and vitals reviewed. Constitutional: She appears well-developed and well-nourished.  HENT:  Head: Normocephalic and atraumatic.  Right Ear: External ear normal.  Left Ear: External ear normal.  Nose: Nose normal.  Mouth/Throat: Oropharynx is clear and moist. No oropharyngeal exudate.  Bilateral stool impactions that were removed with suction and irrigation  Eyes: Conjunctivae and EOM are normal. Pupils are equal, round, and reactive to light. Right eye exhibits no discharge. Left eye exhibits no discharge. No scleral icterus.  Neck: Normal range of motion. Neck supple. No JVD present. No tracheal deviation  present. No thyromegaly present.  Cardiovascular: Normal rate, regular rhythm, normal heart sounds and intact distal pulses.  Exam reveals no gallop and no friction rub.   No murmur heard. Pulmonary/Chest: Effort normal and breath sounds normal. No stridor.  Abdominal: Soft. Bowel sounds are normal. She exhibits no distension and no mass. There is no tenderness. There is no rebound.  Genitourinary: Vagina normal and uterus normal. Guaiac negative stool. No vaginal discharge found.  Bilateral breast exam normal  Musculoskeletal: Normal range of motion.  Lymphadenopathy:    She has no cervical adenopathy.  Neurological: She is alert. She has normal reflexes. No cranial nerve deficit. She exhibits normal muscle tone. Coordination normal.  Skin: Skin is warm and dry.  Psychiatric: She has a normal mood and affect. Her behavior is normal. Judgment and thought content normal.          Assessment & Plan:  Healthy female  History of migraine headaches continue Maxalt 5 mg when necessary  Bilateral cerumen impactions removed with suction and irrigation

## 2014-08-23 ENCOUNTER — Other Ambulatory Visit: Payer: Self-pay

## 2014-08-23 DIAGNOSIS — Z1239 Encounter for other screening for malignant neoplasm of breast: Secondary | ICD-10-CM

## 2014-10-12 ENCOUNTER — Ambulatory Visit: Payer: Managed Care, Other (non HMO)

## 2014-10-25 ENCOUNTER — Ambulatory Visit
Admission: RE | Admit: 2014-10-25 | Discharge: 2014-10-25 | Disposition: A | Discharge status: Home / Self Care | Payer: Managed Care, Other (non HMO) | Source: Ambulatory Visit

## 2014-10-25 DIAGNOSIS — Z1239 Encounter for other screening for malignant neoplasm of breast: Secondary | ICD-10-CM

## 2014-11-01 ENCOUNTER — Other Ambulatory Visit (INDEPENDENT_AMBULATORY_CARE_PROVIDER_SITE_OTHER): Payer: Managed Care, Other (non HMO)

## 2014-11-01 DIAGNOSIS — Z0000 Encounter for general adult medical examination without abnormal findings: Secondary | ICD-10-CM

## 2014-11-01 LAB — POCT URINALYSIS DIPSTICK
BILIRUBIN UA: NEGATIVE
GLUCOSE UA: NEGATIVE
Ketones, UA: NEGATIVE
Leukocytes, UA: NEGATIVE
NITRITE UA: NEGATIVE
Protein, UA: NEGATIVE
Spec Grav, UA: 1.025
Urobilinogen, UA: 0.2
pH, UA: 6

## 2014-11-01 LAB — CBC WITH DIFFERENTIAL/PLATELET
Basophils Absolute: 0.1 10*3/uL (ref 0.0–0.1)
Basophils Relative: 0.7 % (ref 0.0–3.0)
EOS PCT: 3.5 % (ref 0.0–5.0)
Eosinophils Absolute: 0.3 10*3/uL (ref 0.0–0.7)
HCT: 35.5 % — ABNORMAL LOW (ref 36.0–46.0)
HEMOGLOBIN: 11.6 g/dL — AB (ref 12.0–15.0)
LYMPHS ABS: 3.1 10*3/uL (ref 0.7–4.0)
Lymphocytes Relative: 37.7 % (ref 12.0–46.0)
MCHC: 32.8 g/dL (ref 30.0–36.0)
MCV: 91.3 fl (ref 78.0–100.0)
Monocytes Absolute: 0.3 10*3/uL (ref 0.1–1.0)
Monocytes Relative: 4.1 % (ref 3.0–12.0)
NEUTROS ABS: 4.4 10*3/uL (ref 1.4–7.7)
Neutrophils Relative %: 54 % (ref 43.0–77.0)
PLATELETS: 323 10*3/uL (ref 150.0–400.0)
RBC: 3.89 Mil/uL (ref 3.87–5.11)
RDW: 14 % (ref 11.5–15.5)
WBC: 8.2 10*3/uL (ref 4.0–10.5)

## 2014-11-01 LAB — COMPREHENSIVE METABOLIC PANEL
ALT: 9 U/L (ref 0–35)
AST: 12 U/L (ref 0–37)
Albumin: 3.6 g/dL (ref 3.5–5.2)
Alkaline Phosphatase: 53 U/L (ref 39–117)
BUN: 12 mg/dL (ref 6–23)
CHLORIDE: 105 meq/L (ref 96–112)
CO2: 26 mEq/L (ref 19–32)
CREATININE: 0.8 mg/dL (ref 0.4–1.2)
Calcium: 9 mg/dL (ref 8.4–10.5)
GFR: 102.99 mL/min (ref >60.00)
Glucose, Bld: 89 mg/dL (ref 70–99)
Potassium: 3.7 mEq/L (ref 3.5–5.1)
SODIUM: 137 meq/L (ref 135–145)
Total Bilirubin: 0.8 mg/dL (ref 0.2–1.2)
Total Protein: 6.8 g/dL (ref 6.0–8.3)

## 2014-11-01 LAB — LIPID PANEL
CHOL/HDL RATIO: 4
CHOLESTEROL: 178 mg/dL (ref 0–200)
HDL: 49.3 mg/dL (ref >39.00)
LDL CALC: 111 mg/dL — AB (ref 0–99)
NonHDL: 128.7
Triglycerides: 89 mg/dL (ref 0.0–149.0)
VLDL: 17.8 mg/dL (ref 0.0–40.0)

## 2014-11-01 LAB — TSH: TSH: 2.38 u[IU]/mL (ref 0.35–4.50)

## 2014-11-09 ENCOUNTER — Other Ambulatory Visit: Payer: Managed Care, Other (non HMO)

## 2014-11-15 ENCOUNTER — Encounter: Payer: Self-pay | Admitting: Family Medicine

## 2014-11-15 ENCOUNTER — Ambulatory Visit (INDEPENDENT_AMBULATORY_CARE_PROVIDER_SITE_OTHER): Payer: BLUE CROSS/BLUE SHIELD | Admitting: Family Medicine

## 2014-11-15 VITALS — BP 124/84 | Temp 98.7°F | Ht 64.25 in | Wt 200.0 lb

## 2014-11-15 DIAGNOSIS — Z8669 Personal history of other diseases of the nervous system and sense organs: Secondary | ICD-10-CM

## 2014-11-15 DIAGNOSIS — J45901 Unspecified asthma with (acute) exacerbation: Secondary | ICD-10-CM

## 2014-11-15 DIAGNOSIS — Z23 Encounter for immunization: Secondary | ICD-10-CM

## 2014-11-15 DIAGNOSIS — Z0000 Encounter for general adult medical examination without abnormal findings: Secondary | ICD-10-CM

## 2014-11-15 MED ORDER — ALBUTEROL SULFATE HFA 108 (90 BASE) MCG/ACT IN AERS: INHALATION_SPRAY | RESPIRATORY_TRACT | Status: DC

## 2014-11-15 MED ORDER — RIZATRIPTAN BENZOATE 5 MG PO TABS: 5.0000 mg | ORAL_TABLET | ORAL | Status: DC | PRN

## 2014-11-15 NOTE — Progress Notes (Signed)
Pre visit review using our clinic review tool, if applicable. No additional management support is needed unless otherwise documented below in the visit note. 

## 2014-11-15 NOTE — Progress Notes (Signed)
   Subjective:    Patient ID: Tiffany Beltran, female    DOB: 1966-08-26, 49 y.o.   MRN: [id number]  HPI   [name] is a 49 year old single female nonsmoker who comes in today for general physical examination  She's always been Health she has no chronic health problems. She takes no medications on a daily basis except one aspirin tablet.  She takes Maxalt 5 mg when necessary for migraine headaches. In the past year she's only had 3 migraines. Over time they've decreased in frequency and severity  She also takes albuterol 1 or 2 puffs 3-4 times daily when necessary when she has a bad viral infection. She tends to wheeze when she gets a cold sometimes.  She gets routine eye care, dental care, BSE monthly, annual mammography, colonoscopy will do at age 30. No family history of colon cancer polyps  Vaccinations updated by Apolonio Schneiders  Pap smear January 2015 normal. She's not sexually active P all her Paps a been normal in the past. Therefore recommend every 3 years for pelvics and Paps  She's had a history of low-grade anemia in the past. We tried on oral iron however it didn't seem to make much difference. Her hemoglobin 2 weeks ago was 11.6. Review of systems is   Review of Systems  Constitutional: Negative.   HENT: Negative.   Eyes: Negative.   Respiratory: Negative.   Cardiovascular: Negative.   Gastrointestinal: Negative.   Endocrine: Negative.   Genitourinary: Negative.   Musculoskeletal: Negative.   Skin: Negative.   Allergic/Immunologic: Negative.   Neurological: Negative.   Hematological: Negative.   Psychiatric/Behavioral: Negative.        Objective:   Physical Exam  Constitutional: She appears well-developed and well-nourished.  HENT:  Head: Normocephalic and atraumatic.  Right Ear: External ear normal.  Left Ear: External ear normal.  Nose: Nose normal.  Mouth/Throat: Oropharynx is clear and moist.  Eyes: EOM are normal. Pupils are equal, round, and reactive to light.    Neck: Normal range of motion. Neck supple. No JVD present. No tracheal deviation present. No thyromegaly present.  Cardiovascular: Normal rate, regular rhythm, normal heart sounds and intact distal pulses.  Exam reveals no gallop and no friction rub.   No murmur heard. Pulmonary/Chest: Effort normal and breath sounds normal. No stridor. No respiratory distress. She has no wheezes. She has no rales. She exhibits no tenderness.  Abdominal: Soft. Bowel sounds are normal. She exhibits no distension and no mass. There is no tenderness. There is no rebound and no guarding.  Genitourinary:  Bilateral breast exam normal  Musculoskeletal: Normal range of motion.  Lymphadenopathy:    She has no cervical adenopathy.  Neurological: She is alert. She has normal reflexes. No cranial nerve deficit. She exhibits normal muscle tone. Coordination normal.  Skin: Skin is warm and dry. No rash noted. No erythema. No pallor.  Psychiatric: She has a normal mood and affect. Her behavior is normal. Judgment and thought content normal.          Assessment & PlanHealthy female  History of migraine headaches, as:  Healthy female  History migraine headaches..... Continue Maxalt when necessary  History of asthma with viral infections..... Albuterol when necessary

## 2014-11-15 NOTE — Patient Instructions (Signed)
Continue your current good health habits and exercise return in one year for general physical exam sooner if any problem

## 2015-06-05 ENCOUNTER — Encounter: Payer: Self-pay | Admitting: Internal Medicine

## 2015-06-05 ENCOUNTER — Telehealth: Payer: Self-pay | Admitting: Family Medicine

## 2015-06-05 ENCOUNTER — Ambulatory Visit (INDEPENDENT_AMBULATORY_CARE_PROVIDER_SITE_OTHER): Payer: BLUE CROSS/BLUE SHIELD | Admitting: Internal Medicine

## 2015-06-05 VITALS — BP 150/90 | HR 62 | Temp 98.5°F | Resp 20 | Ht 64.25 in | Wt 190.0 lb

## 2015-06-05 DIAGNOSIS — S7011XA Contusion of right thigh, initial encounter: Secondary | ICD-10-CM

## 2015-06-05 NOTE — Telephone Encounter (Signed)
Patient Name: Tiffany Beltran  DOB: Aug 27, 1966    Initial Comment Caller states she has a bruise on right thigh from 3 weeks ago, last week it starting swelling and became hard   Nurse Assessment  Nurse: Leilani Merl, RN, Nira Conn Date/Time (Eastern Time): 06/05/2015 9:26:16 AM  Confirm and document reason for call. If symptomatic, describe symptoms. ---Caller states she has a bruise from an injury on right thigh from 3 weeks ago, last week it starting swelling and became hard  Has the patient traveled out of the country within the last 30 days? ---Not Applicable  Does the patient require triage? ---Yes  Related visit to physician within the last 2 weeks? ---No  Does the PT have any chronic conditions? (i.e. diabetes, asthma, etc.) ---Yes  List chronic conditions. ---migraines  Did the patient indicate they were pregnant? ---No     Guidelines    Guideline Title Affirmed Question Affirmed Notes  Bruises [1] After 10 days AND [2] bruise not fading    Final Disposition User   See PCP When Office is Open (within 3 days) Standifer, RN, Water quality scientist    Comments  Appt with Dr. Raliegh Ip. this morning at 11 am.   Disagree/Comply: Leta Baptist

## 2015-06-05 NOTE — Progress Notes (Signed)
   Subjective:    Patient ID: Tiffany Beltran, female    DOB: 20-Jul-1966, 49 y.o.   MRN: [id number]  HPI  49 year old patient who has a history of asthma, which has been stable.  She sustained trauma to the right anterior thigh about 3 weeks ago and was a bit concerned about persistent bruising.  Very little pain.  She also wishes to start training for a 5K race  Past Medical History  Diagnosis Date  . Headache(784.0)   . Undiagnosed cardiac murmurs     MR and AR on echocardiogram    History   Social History  . Marital Status: Single    Spouse Name: N/A  . Number of Children: N/A  . Years of Education: N/A   Occupational History  . Not on file.   Social History Main Topics  . Smoking status: Never Smoker   . Smokeless tobacco: Not on file  . Alcohol Use: No  . Drug Use: No  . Sexual Activity: Not on file   Other Topics Concern  . Not on file   Social History Narrative    No past surgical history on file.  Family History  Problem Relation Age of Onset  . Glaucoma Mother   . Hypertension Other     No Known Allergies  Current Outpatient Prescriptions on File Prior to Visit  Medication Sig Dispense Refill  . albuterol (PROVENTIL HFA;VENTOLIN HFA) 108 (90 BASE) MCG/ACT inhaler One puff 30 minutes prior to exercise 18 g 2  . aspirin 81 MG tablet Take 81 mg by mouth daily.      . Multiple Vitamin (MULTIVITAMIN) tablet Take 1 tablet by mouth daily.      . Multiple Vitamins-Minerals (BIOTECT PLUS PO) Take by mouth.    . rizatriptan (MAXALT) 5 MG tablet Take 1 tablet (5 mg total) by mouth as needed. May repeat in 2 hours if needed 6 tablet 5   No current facility-administered medications on file prior to visit.    BP 150/90 mmHg  Pulse 62  Temp(Src) 98.5 F (36.9 C) (Oral)  Resp 20  Ht 5' 4.25" (1.632 m)  Wt 190 lb (86.183 kg)  BMI 32.36 kg/m2  SpO2 97%     Review of Systems  Skin: Positive for wound.       Objective:   Physical Exam  Constitutional:  She appears well-developed and well-nourished. No distress.  Skin:  A 2 cm nodule consistent with a hematoma with surrounding ecchymoses noted involving the right anterior mid thigh area          Assessment & Plan:   Hematoma/ecchymoses right anterior thigh.  Patient was reassured.  Local measures discussed

## 2015-06-05 NOTE — Progress Notes (Signed)
Pre visit review using our clinic review tool, if applicable. No additional management support is needed unless otherwise documented below in the visit note. 

## 2015-06-05 NOTE — Patient Instructions (Signed)
Call or return to clinic prn if these symptoms worsen or fail to improve as anticipated.

## 2015-09-11 ENCOUNTER — Other Ambulatory Visit: Payer: Self-pay

## 2015-09-11 DIAGNOSIS — Z1231 Encounter for screening mammogram for malignant neoplasm of breast: Secondary | ICD-10-CM

## 2015-09-26 ENCOUNTER — Telehealth: Payer: Self-pay | Admitting: Family Medicine

## 2015-09-26 DIAGNOSIS — R5383 Other fatigue: Secondary | ICD-10-CM

## 2015-09-26 NOTE — Telephone Encounter (Signed)
Lab ordered per Dr Sherren Mocha

## 2015-09-26 NOTE — Addendum Note (Signed)
Addended by: Westley Hummer B on: 09/26/2015 01:32 PM   Modules accepted: Orders

## 2015-09-26 NOTE — Telephone Encounter (Signed)
Pt said dr todd wanted vit d not b.please correct thanks

## 2015-09-26 NOTE — Telephone Encounter (Signed)
Pt would like to add vit b to cpx labs blood work due to being african [place]. Can I add?

## 2015-10-31 ENCOUNTER — Ambulatory Visit
Admission: RE | Admit: 2015-10-31 | Discharge: 2015-10-31 | Disposition: A | Discharge status: Home / Self Care | Payer: BLUE CROSS/BLUE SHIELD | Source: Ambulatory Visit

## 2015-10-31 DIAGNOSIS — Z1231 Encounter for screening mammogram for malignant neoplasm of breast: Secondary | ICD-10-CM

## 2016-02-06 ENCOUNTER — Other Ambulatory Visit (INDEPENDENT_AMBULATORY_CARE_PROVIDER_SITE_OTHER): Payer: Managed Care, Other (non HMO)

## 2016-02-06 DIAGNOSIS — Z0000 Encounter for general adult medical examination without abnormal findings: Secondary | ICD-10-CM | POA: Diagnosis not present

## 2016-02-06 DIAGNOSIS — R5383 Other fatigue: Secondary | ICD-10-CM

## 2016-02-06 LAB — CBC WITH DIFFERENTIAL/PLATELET
BASOS ABS: 0 10*3/uL (ref 0.0–0.1)
Basophils Relative: 0.5 % (ref 0.0–3.0)
Eosinophils Absolute: 0.2 10*3/uL (ref 0.0–0.7)
Eosinophils Relative: 3.1 % (ref 0.0–5.0)
HEMATOCRIT: 35.7 % — AB (ref 36.0–46.0)
Hemoglobin: 12 g/dL (ref 12.0–15.0)
LYMPHS PCT: 38.9 % (ref 12.0–46.0)
Lymphs Abs: 3 10*3/uL (ref 0.7–4.0)
MCHC: 33.6 g/dL (ref 30.0–36.0)
MCV: 91.4 fl (ref 78.0–100.0)
MONOS PCT: 5.3 % (ref 3.0–12.0)
Monocytes Absolute: 0.4 10*3/uL (ref 0.1–1.0)
Neutro Abs: 4 10*3/uL (ref 1.4–7.7)
Neutrophils Relative %: 52.2 % (ref 43.0–77.0)
Platelets: 341 10*3/uL (ref 150.0–400.0)
RBC: 3.91 Mil/uL (ref 3.87–5.11)
RDW: 13.2 % (ref 11.5–15.5)
WBC: 7.7 10*3/uL (ref 4.0–10.5)

## 2016-02-06 LAB — HEPATIC FUNCTION PANEL
ALBUMIN: 3.9 g/dL (ref 3.5–5.2)
ALK PHOS: 46 U/L (ref 39–117)
ALT: 31 U/L (ref 0–35)
AST: 55 U/L — AB (ref 0–37)
Bilirubin, Direct: 0.1 mg/dL (ref 0.0–0.3)
TOTAL PROTEIN: 6.6 g/dL (ref 6.0–8.3)
Total Bilirubin: 0.8 mg/dL (ref 0.2–1.2)

## 2016-02-06 LAB — POC URINALSYSI DIPSTICK (AUTOMATED)
BILIRUBIN UA: NEGATIVE
GLUCOSE UA: NEGATIVE
Ketones, UA: NEGATIVE
Leukocytes, UA: NEGATIVE
NITRITE UA: NEGATIVE
Protein, UA: NEGATIVE
SPEC GRAV UA: 1.02
Urobilinogen, UA: 0.2
pH, UA: 7

## 2016-02-06 LAB — BASIC METABOLIC PANEL
BUN: 14 mg/dL (ref 6–23)
CALCIUM: 9 mg/dL (ref 8.4–10.5)
CO2: 29 mEq/L (ref 19–32)
Chloride: 105 mEq/L (ref 96–112)
Creatinine, Ser: 0.81 mg/dL (ref 0.40–1.20)
GFR: 96.63 mL/min (ref >60.00)
Glucose, Bld: 97 mg/dL (ref 70–99)
Potassium: 4.5 mEq/L (ref 3.5–5.1)
SODIUM: 138 meq/L (ref 135–145)

## 2016-02-06 LAB — LIPID PANEL
CHOL/HDL RATIO: 3
Cholesterol: 184 mg/dL (ref 0–200)
HDL: 57.7 mg/dL (ref >39.00)
LDL Cholesterol: 113 mg/dL — ABNORMAL HIGH (ref 0–99)
NonHDL: 126.09
TRIGLYCERIDES: 63 mg/dL (ref 0.0–149.0)
VLDL: 12.6 mg/dL (ref 0.0–40.0)

## 2016-02-06 LAB — VITAMIN B12: Vitamin B-12: 428 pg/mL (ref 211–911)

## 2016-02-06 LAB — TSH: TSH: 2.21 u[IU]/mL (ref 0.35–4.50)

## 2016-02-06 LAB — VITAMIN D 25 HYDROXY (VIT D DEFICIENCY, FRACTURES): VITD: 23.37 ng/mL — AB (ref 30.00–100.00)

## 2016-02-13 ENCOUNTER — Encounter: Payer: Self-pay | Admitting: Family Medicine

## 2016-02-13 ENCOUNTER — Ambulatory Visit (INDEPENDENT_AMBULATORY_CARE_PROVIDER_SITE_OTHER): Payer: Managed Care, Other (non HMO) | Admitting: Family Medicine

## 2016-02-13 VITALS — BP 120/88 | Temp 99.1°F | Ht 64.0 in | Wt 197.0 lb

## 2016-02-13 DIAGNOSIS — Z0000 Encounter for general adult medical examination without abnormal findings: Secondary | ICD-10-CM | POA: Diagnosis not present

## 2016-02-13 DIAGNOSIS — Z8669 Personal history of other diseases of the nervous system and sense organs: Secondary | ICD-10-CM

## 2016-02-13 DIAGNOSIS — G43009 Migraine without aura, not intractable, without status migrainosus: Secondary | ICD-10-CM | POA: Diagnosis not present

## 2016-02-13 MED ORDER — RIZATRIPTAN BENZOATE 5 MG PO TABS: 5.0000 mg | ORAL_TABLET | ORAL | Status: DC | PRN

## 2016-02-13 NOTE — Progress Notes (Signed)
Pre visit review using our clinic review tool, if applicable. No additional management support is needed unless otherwise documented below in the visit note. 

## 2016-02-13 NOTE — Patient Instructions (Signed)
Return in May 3 for your Pap smear  For your hands I would recommend the following..... Short arm splints at night......Marland Kitchen Motrin 4 mg twice daily with food......Marland Kitchen elevation and ice prior to bedtime  Dr. Fredna Dow........ at the hand center if the above does not resolve  Continue your zumba  Carbohydrate free diet

## 2016-02-13 NOTE — Progress Notes (Signed)
   Subjective:    Patient ID: Tiffany Beltran, female    DOB: 1966-05-21, 50 y.o.   MRN: [id number]  HPI [name] is a 50 year old single female nonsmoker who comes in today for general physical examination  She's on his been next health she's had no chronic health problems.  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 No family history of colon cancer polyps  's family history Sr. recently diagnosed with cancer. She was told runs to her father's side which is not her father had 2 different fathers  Social history,,,,,,,, she works in Radio producer originally from [place]  Vaccinations up-to-date   Review of Systems  Constitutional: Negative.   HENT: Negative.   Eyes: Negative.   Respiratory: Negative.   Cardiovascular: Negative.   Gastrointestinal: Negative.   Endocrine: Negative.   Genitourinary: Negative.   Musculoskeletal: Negative.   Skin: Negative.   Allergic/Immunologic: Negative.   Neurological: Negative.   Hematological: Negative.   Psychiatric/Behavioral: Negative.        Objective:   Physical Exam  Constitutional: She appears well-developed and well-nourished.  HENT:  Head: Normocephalic and atraumatic.  Right Ear: External ear normal.  Left Ear: External ear normal.  Nose: Nose normal.  Mouth/Throat: Oropharynx is clear and moist.  Eyes: EOM are normal. Pupils are equal, round, and reactive to light.  Neck: Normal range of motion. Neck supple. No JVD present. No tracheal deviation present. No thyromegaly present.  Cardiovascular: Normal rate, regular rhythm, normal heart sounds and intact distal pulses.  Exam reveals no gallop and no friction rub.   No murmur heard. Pulmonary/Chest: Effort normal and breath sounds  normal. No stridor. No respiratory distress. She has no wheezes. She has no rales. She exhibits no tenderness.  Abdominal: Soft. Bowel sounds are normal. She exhibits no distension and no mass. There is no tenderness. There is no rebound and no guarding.  Genitourinary:  Bilateral breast exam normal  Her periods started yesterday where for would defer Pap for 3 weeks  Musculoskeletal: Normal range of motion.  Examination the hands and wrists were normal except for some tenderness in the tendons.  Lymphadenopathy:    She has no cervical adenopathy.  Neurological: She is alert. She has normal reflexes. No cranial nerve deficit. She exhibits normal muscle tone. Coordination normal.  Skin: Skin is warm and dry. No rash noted. No erythema. No pallor.  Psychiatric: She has a normal mood and affect. Her behavior is normal. Judgment and thought content normal.  Nursing note and vitals reviewed.         Assessment & Plan:  Healthy female  Tendinitis right and left thumb,,,,,,,,, splints,,,,,,,,,,,,,,,,,,,,, Motrin elevation and ice return when necessary  Patellar pain probable small fracture since is slow to heal but since exam is otherwise fine no x-rays,,  New family history data,,,,,,,,,, sister 2-year-old with bilateral breast cancer consider genetic studies

## 2016-03-06 ENCOUNTER — Ambulatory Visit: Payer: Managed Care, Other (non HMO) | Admitting: Family Medicine

## 2016-03-11 ENCOUNTER — Ambulatory Visit: Payer: Managed Care, Other (non HMO) | Admitting: Family Medicine

## 2016-03-11 ENCOUNTER — Encounter: Payer: Self-pay | Admitting: Family Medicine

## 2016-03-11 ENCOUNTER — Other Ambulatory Visit (HOSPITAL_COMMUNITY)
Admission: RE | Admit: 2016-03-11 | Discharge: 2016-03-11 | Disposition: A | Discharge status: Home / Self Care | Payer: Managed Care, Other (non HMO) | Source: Ambulatory Visit | Attending: Family Medicine | Admitting: Family Medicine

## 2016-03-11 ENCOUNTER — Ambulatory Visit (INDEPENDENT_AMBULATORY_CARE_PROVIDER_SITE_OTHER): Payer: Managed Care, Other (non HMO) | Admitting: Family Medicine

## 2016-03-11 DIAGNOSIS — Z1151 Encounter for screening for human papillomavirus (HPV): Secondary | ICD-10-CM | POA: Diagnosis not present

## 2016-03-11 DIAGNOSIS — Z01419 Encounter for gynecological examination (general) (routine) without abnormal findings: Secondary | ICD-10-CM | POA: Insufficient documentation

## 2016-03-11 DIAGNOSIS — Z0000 Encounter for general adult medical examination without abnormal findings: Secondary | ICD-10-CM | POA: Diagnosis not present

## 2016-03-11 NOTE — Patient Instructions (Signed)
Return when necessary 

## 2016-03-11 NOTE — Progress Notes (Signed)
   Subjective:    Patient ID: Modena Steedley, female    DOB: [birth year], 50 y.o.   MRN: [id number]  HPI Renata is a 50 year old single female nonsmoker who comes in today to complete her physical examination  We saw her couple weeks ago but she had started her menstrual cycle the day before physical therefore we cannot do an adequate pelvic and rectal exam. She comes in today to complete her exam   Review of Systems Review of systems negative    Objective:   Physical Exam Well-developed well-nourished female no acute distress vital signs stable she's afebrile pelvic exam external genitalia within normal limits vaginal vault was normal cervix is visualized pressures done bimanual exam negative  Rectum normal stool guaiac-negative       Assessment & Plan:  Normal pelvic and rectal exam return when necessary  Earwax left ear........Marland Kitchen removed by her condition

## 2016-03-12 LAB — CYTOLOGY - PAP

## 2016-04-18 ENCOUNTER — Encounter: Payer: Self-pay | Admitting: Family Medicine

## 2016-04-18 ENCOUNTER — Ambulatory Visit (INDEPENDENT_AMBULATORY_CARE_PROVIDER_SITE_OTHER): Payer: Managed Care, Other (non HMO) | Admitting: Family Medicine

## 2016-04-18 VITALS — BP 150/84 | HR 65 | Temp 98.8°F | Ht 64.0 in | Wt 199.0 lb

## 2016-04-18 DIAGNOSIS — L989 Disorder of the skin and subcutaneous tissue, unspecified: Secondary | ICD-10-CM

## 2016-04-18 NOTE — Progress Notes (Signed)
Pre visit review using our clinic review tool, if applicable. No additional management support is needed unless otherwise documented below in the visit note. 

## 2016-04-19 NOTE — Progress Notes (Signed)
   Subjective:    Patient ID: Tiffany Beltran, female    DOB: 01-Nov-1966, 50 y.o.   MRN: [id number]  HPI Here for a lesion on the right thumb that appeared about 4 weeks ago. It gets tender at times. It bleeds a lot if she hits it against something. It is slowly getting larger. She keeps it covered with a Bandaid.    Review of Systems  Constitutional: Negative.        Objective:   Physical Exam  Constitutional: She appears well-developed and well-nourished.  Skin:  The right thumb has a raised red tender granulomatous lesion which is 1.0 cm across          Assessment & Plan:  Granuloma, we will refer her to have this removed.  Laurey Morale, MD

## 2016-09-30 ENCOUNTER — Other Ambulatory Visit: Payer: Self-pay | Admitting: Family Medicine

## 2016-09-30 DIAGNOSIS — Z1231 Encounter for screening mammogram for malignant neoplasm of breast: Secondary | ICD-10-CM

## 2016-11-08 ENCOUNTER — Ambulatory Visit: Payer: Managed Care, Other (non HMO)

## 2016-11-11 ENCOUNTER — Telehealth: Payer: Self-pay | Admitting: Family Medicine

## 2016-11-11 ENCOUNTER — Ambulatory Visit
Admission: RE | Admit: 2016-11-11 | Discharge: 2016-11-11 | Disposition: A | Discharge status: Home / Self Care | Payer: Managed Care, Other (non HMO) | Source: Ambulatory Visit | Attending: Family Medicine | Admitting: Family Medicine

## 2016-11-11 DIAGNOSIS — Z1231 Encounter for screening mammogram for malignant neoplasm of breast: Secondary | ICD-10-CM

## 2016-11-11 DIAGNOSIS — R7989 Other specified abnormal findings of blood chemistry: Secondary | ICD-10-CM

## 2016-11-11 NOTE — Telephone Encounter (Signed)
Spoke with Pt and told her to call Tiffany Beltran and make an appointment. Also will put in order for Vit D for labs.

## 2016-11-11 NOTE — Telephone Encounter (Signed)
1.  Pt would like to know if she needs to do a vit d added to her CPX labs this year? Pt had done last year.  2.  Pt would like a referral to a dermatologist for skin discoloration under her arm near breast.

## 2017-02-11 ENCOUNTER — Other Ambulatory Visit: Payer: Managed Care, Other (non HMO)

## 2017-02-18 ENCOUNTER — Encounter: Payer: Self-pay | Admitting: Family Medicine

## 2017-02-18 ENCOUNTER — Ambulatory Visit (INDEPENDENT_AMBULATORY_CARE_PROVIDER_SITE_OTHER): Payer: Managed Care, Other (non HMO) | Admitting: Family Medicine

## 2017-02-18 VITALS — BP 130/80 | Temp 98.2°F | Ht 64.0 in | Wt 206.0 lb

## 2017-02-18 DIAGNOSIS — J45990 Exercise induced bronchospasm: Secondary | ICD-10-CM | POA: Diagnosis not present

## 2017-02-18 DIAGNOSIS — Z0000 Encounter for general adult medical examination without abnormal findings: Secondary | ICD-10-CM | POA: Diagnosis not present

## 2017-02-18 DIAGNOSIS — Z8669 Personal history of other diseases of the nervous system and sense organs: Secondary | ICD-10-CM

## 2017-02-18 DIAGNOSIS — G43009 Migraine without aura, not intractable, without status migrainosus: Secondary | ICD-10-CM | POA: Diagnosis not present

## 2017-02-18 DIAGNOSIS — J45901 Unspecified asthma with (acute) exacerbation: Secondary | ICD-10-CM | POA: Diagnosis not present

## 2017-02-18 LAB — LIPID PANEL
CHOL/HDL RATIO: 3
Cholesterol: 196 mg/dL (ref 0–200)
HDL: 59.8 mg/dL (ref >39.00)
LDL CALC: 125 mg/dL — AB (ref 0–99)
NONHDL: 135.76
Triglycerides: 55 mg/dL (ref 0.0–149.0)
VLDL: 11 mg/dL (ref 0.0–40.0)

## 2017-02-18 LAB — CBC WITH DIFFERENTIAL/PLATELET
BASOS PCT: 1.3 % (ref 0.0–3.0)
Basophils Absolute: 0.1 10*3/uL (ref 0.0–0.1)
EOS ABS: 0.3 10*3/uL (ref 0.0–0.7)
EOS PCT: 4.2 % (ref 0.0–5.0)
HEMATOCRIT: 35.7 % — AB (ref 36.0–46.0)
HEMOGLOBIN: 11.8 g/dL — AB (ref 12.0–15.0)
LYMPHS PCT: 37 % (ref 12.0–46.0)
Lymphs Abs: 2.4 10*3/uL (ref 0.7–4.0)
MCHC: 33 g/dL (ref 30.0–36.0)
MCV: 91.9 fl (ref 78.0–100.0)
Monocytes Absolute: 0.3 10*3/uL (ref 0.1–1.0)
Monocytes Relative: 5.4 % (ref 3.0–12.0)
NEUTROS PCT: 52.1 % (ref 43.0–77.0)
Neutro Abs: 3.4 10*3/uL (ref 1.4–7.7)
PLATELETS: 350 10*3/uL (ref 150.0–400.0)
RBC: 3.88 Mil/uL (ref 3.87–5.11)
RDW: 13.5 % (ref 11.5–15.5)
WBC: 6.4 10*3/uL (ref 4.0–10.5)

## 2017-02-18 LAB — POCT URINALYSIS DIPSTICK
BILIRUBIN UA: NEGATIVE
GLUCOSE UA: NEGATIVE
KETONES UA: NEGATIVE
LEUKOCYTES UA: NEGATIVE
Nitrite, UA: NEGATIVE
PH UA: 6 (ref 5.0–8.0)
Protein, UA: NEGATIVE
Spec Grav, UA: 1.02 (ref 1.010–1.025)
Urobilinogen, UA: 0.2 E.U./dL

## 2017-02-18 LAB — HEPATIC FUNCTION PANEL
ALT: 9 U/L (ref 0–35)
AST: 13 U/L (ref 0–37)
Albumin: 3.9 g/dL (ref 3.5–5.2)
Alkaline Phosphatase: 56 U/L (ref 39–117)
BILIRUBIN TOTAL: 0.8 mg/dL (ref 0.2–1.2)
Bilirubin, Direct: 0.1 mg/dL (ref 0.0–0.3)
Total Protein: 6.5 g/dL (ref 6.0–8.3)

## 2017-02-18 LAB — BASIC METABOLIC PANEL
BUN: 14 mg/dL (ref 6–23)
CO2: 30 meq/L (ref 19–32)
Calcium: 9 mg/dL (ref 8.4–10.5)
Chloride: 103 mEq/L (ref 96–112)
Creatinine, Ser: 0.82 mg/dL (ref 0.40–1.20)
GFR: 94.87 mL/min (ref >60.00)
GLUCOSE: 95 mg/dL (ref 70–99)
POTASSIUM: 4.1 meq/L (ref 3.5–5.1)
Sodium: 138 mEq/L (ref 135–145)

## 2017-02-18 LAB — TSH: TSH: 1.93 u[IU]/mL (ref 0.35–4.50)

## 2017-02-18 MED ORDER — ALBUTEROL SULFATE HFA 108 (90 BASE) MCG/ACT IN AERS: INHALATION_SPRAY | RESPIRATORY_TRACT | 2 refills | Status: DC

## 2017-02-18 MED ORDER — RIZATRIPTAN BENZOATE 5 MG PO TABS: 5.0000 mg | ORAL_TABLET | ORAL | 5 refills | Status: AC | PRN

## 2017-02-18 NOTE — Patient Instructions (Addendum)
Continue good exercise program,,,,,,,,,,,,,,,,,, carbohydrate free diet,,,,,,,, goal to lose 12 pounds in the next 12 months  Albuterol when necessary for exercise-induced asthma  Maxalt when necessary for migraines  Labs today....... I will call you if there is anything abnormalp...,  You get a call from GI to set up a consult to discuss screening colonoscopy,, if they do not call you call them directly [phone]

## 2017-02-18 NOTE — Progress Notes (Signed)
Pre visit review using our clinic review tool, if applicable. No additional management support is needed unless otherwise documented below in the visit note. 

## 2017-02-18 NOTE — Progress Notes (Signed)
Tiffany Beltran is a 51 year old single female nonsmoker who comes in today for general physical examination  She's always been Health she's had no chronic health problems except migraine headaches and occasional exercise-induced asthma. The last year she had 2 migraines. They're decreasing in frequency and severity. She takes Maxalt 5 mg when necessary  She is albuterol 1 puff 30 minutes prior to exercise because of a history of exercise-induced asthma. She is runs a couple 5K Z year. 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  She gets routine eye care, dental care, BSE monthly, annual mammography, colonoscopy due this year.  Vaccinations up-to-date  OMP one week ago some vague menopausal symptoms with some mild hot flushes nothing severe.  BMI 34.20. Last year she was 199 pounds this year she is 39. Advised her to cut out all the carbohydrates and lose about 12 pounds in the next 12 months.  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  14 point review of systems reviewed and otherwise negative.vs BP (!) 146/86 (BP Location: Right Arm, Patient Position: Sitting, Cuff Size: Large)   Temp 98.2 F (36.8 C) (Oral)   Ht 5\' 4"  (1.626 m)   Wt 206 lb (93.4 kg)   BMI 35.36 kg/m  Examination HEENT were negative neck was supple thyroid not enlarged no carotid bruits cardiopulmonary exam normal breast exam normal except for multiple fibrocystic changes throughout both breasts. They're most prominent in the right breast between 12 and 3. There are multiple fibrocystic type nodules. There are small rubbery and movable.  #1 healthy female  #2 exercise-induced asthma,,,,,,,,, continue albuterol when necessary  #3 migraine headaches,,,,,,,, continue Maxalt  #4 overweight............... carbohydrate free diet lose 12 pounds in the next 12 months.  Abdominal exam  negative pelvic and rectal deferred extremities normal skin normal peripheral pulses

## 2017-02-20 ENCOUNTER — Encounter: Payer: Self-pay | Admitting: Gastroenterology

## 2017-03-05 ENCOUNTER — Ambulatory Visit (AMBULATORY_SURGERY_CENTER): Payer: Self-pay

## 2017-03-05 VITALS — Ht 64.0 in | Wt 207.0 lb

## 2017-03-05 DIAGNOSIS — Z1211 Encounter for screening for malignant neoplasm of colon: Secondary | ICD-10-CM

## 2017-03-05 MED ORDER — NA SULFATE-K SULFATE-MG SULF 17.5-3.13-1.6 GM/177ML PO SOLN: 1.0000 | Freq: Once | ORAL | 0 refills | Status: AC

## 2017-03-05 NOTE — Progress Notes (Signed)
Denies allergies to eggs or soy products. Denies complication of anesthesia or sedation. Denies use of weight loss medication. Denies use of O2.   Emmi instructions given for colonoscopy.  

## 2017-03-19 ENCOUNTER — Encounter: Payer: Self-pay | Admitting: Gastroenterology

## 2017-03-19 ENCOUNTER — Ambulatory Visit (AMBULATORY_SURGERY_CENTER): Payer: Managed Care, Other (non HMO) | Admitting: Gastroenterology

## 2017-03-19 VITALS — BP 150/85 | HR 72 | Temp 99.1°F | Resp 17 | Ht 64.0 in | Wt 207.0 lb

## 2017-03-19 DIAGNOSIS — Z1212 Encounter for screening for malignant neoplasm of rectum: Secondary | ICD-10-CM | POA: Diagnosis not present

## 2017-03-19 DIAGNOSIS — K621 Rectal polyp: Secondary | ICD-10-CM | POA: Diagnosis not present

## 2017-03-19 DIAGNOSIS — Z1211 Encounter for screening for malignant neoplasm of colon: Secondary | ICD-10-CM

## 2017-03-19 DIAGNOSIS — D128 Benign neoplasm of rectum: Secondary | ICD-10-CM

## 2017-03-19 MED ORDER — SODIUM CHLORIDE 0.9 % IV SOLN: 500.0000 mL | INTRAVENOUS | Status: DC

## 2017-03-19 NOTE — Patient Instructions (Signed)
Handouts given : Polyps and Diverticulosis.  YOU HAD AN ENDOSCOPIC PROCEDURE TODAY AT THE [facility]:   Refer to the procedure report that was given to you for any specific questions about what was found during the examination.  If the procedure report does not answer your questions, please call your gastroenterologist to clarify.  If you requested that your care partner not be given the details of your procedure findings, then the procedure report has been included in a sealed envelope for you to review at your convenience later.  YOU SHOULD EXPECT: Some feelings of bloating in the abdomen. Passage of more gas than usual.  Walking can help get rid of the air that was put into your GI tract during the procedure and reduce the bloating. If you had a lower endoscopy (such as a colonoscopy or flexible sigmoidoscopy) you may notice spotting of blood in your stool or on the toilet paper. If you underwent a bowel prep for your procedure, you may not have a normal bowel movement for a few days.  Please Note:  You might notice some irritation and congestion in your nose or some drainage.  This is from the oxygen used during your procedure.  There is no need for concern and it should clear up in a day or so.  SYMPTOMS TO REPORT IMMEDIATELY:   Following lower endoscopy (colonoscopy or flexible sigmoidoscopy):  Excessive amounts of blood in the stool  Significant tenderness or worsening of abdominal pains  Swelling of the abdomen that is new, acute  Fever of 100F or higher    For urgent or emergent issues, a gastroenterologist can be reached at any hour by calling [phone].   DIET:  We do recommend a small meal at first, but then you may proceed to your regular diet.  Drink plenty of fluids but you should avoid alcoholic beverages for 24 hours.  ACTIVITY:  You should plan to take it easy for the rest of today and you should NOT DRIVE or use heavy machinery until tomorrow (because of  the sedation medicines used during the test).    FOLLOW UP: Our staff will call the number listed on your records the next business day following your procedure to check on you and address any questions or concerns that you may have regarding the information given to you following your procedure. If we do not reach you, we will leave a message.  However, if you are feeling well and you are not experiencing any problems, there is no need to return our call.  We will assume that you have returned to your regular daily activities without incident.  If any biopsies were taken you will be contacted by phone or by letter within the next 1-3 weeks.  Please call us at [phone] if you have not heard about the biopsies in 3 weeks.    SIGNATURES/CONFIDENTIALITY: You and/or your care partner have signed paperwork which will be entered into your electronic medical record.  These signatures attest to the fact that that the information above on your After Visit Summary has been reviewed and is understood.  Full responsibility of the confidentiality of this discharge information lies with you and/or your care-partner. 

## 2017-03-19 NOTE — Progress Notes (Signed)
A and O x3. Report to RN. Tolerated MAC anesthesia well.

## 2017-03-19 NOTE — Progress Notes (Signed)
Called to room to assist during endoscopic procedure.  Patient ID and intended procedure confirmed with present staff. Received instructions for my participation in the procedure from the performing physician.  

## 2017-03-19 NOTE — Progress Notes (Signed)
Patient states no changes in medical or surgical history since Pre-Visit appt.

## 2017-03-19 NOTE — Op Note (Signed)
[facility] Patient Name: Tiffany Beltran Procedure Date: 03/19/2017 11:39 AM MRN: [id number] Endoscopist: Mauri Pole , MD Age: 51 Referring MD:  Date of Birth: 1966/02/09 Gender: Female Account #: [id number] Procedure:                Colonoscopy Indications:              Screening for colorectal malignant neoplasm, This                            is the patient's first colonoscopy Medicines:                Monitored Anesthesia Care Procedure:                Pre-Anesthesia Assessment:                           - Prior to the procedure, a History and Physical                            was performed, and patient medications and                            allergies were reviewed. The patient's tolerance of                            previous anesthesia was also reviewed. The risks                            and benefits of the procedure and the sedation                            options and risks were discussed with the patient.                            All questions were answered, and informed consent                            was obtained. Prior Anticoagulants: The patient has                            taken no previous anticoagulant or antiplatelet                            agents. ASA Grade Assessment: II - A patient with                            mild systemic disease. After reviewing the risks                            and benefits, the patient was deemed in                            satisfactory condition to undergo the procedure.  After obtaining informed consent, the colonoscope                            was passed under direct vision. Throughout the                            procedure, the patient's blood pressure, pulse, and                            oxygen saturations were monitored continuously. The                            Colonoscope was introduced through the anus and                            advanced to the the  terminal ileum, with                            identification of the appendiceal orifice and IC                            valve. The colonoscopy was performed without                            difficulty. The patient tolerated the procedure                            well. The quality of the bowel preparation was                            excellent. The terminal ileum, ileocecal valve,                            appendiceal orifice, and rectum were photographed. Scope In: 11:51:05 AM Scope Out: 12:03:26 PM Scope Withdrawal Time: 0 hours 9 minutes 3 seconds  Total Procedure Duration: 0 hours 12 minutes 21 seconds  Findings:                 The perianal and digital rectal examinations were                            normal.                           A 5 mm polyp was found in the rectum. The polyp was                            sessile. The polyp was removed with a cold snare.                            Resection and retrieval were complete.                           Scattered small-mouthed diverticula were found in  the sigmoid colon and descending colon.                           Non-bleeding internal hemorrhoids were found during                            retroflexion. The hemorrhoids were small. Complications:            No immediate complications. Estimated Blood Loss:     Estimated blood loss was minimal. Impression:               - One 5 mm polyp in the rectum, removed with a cold                            snare. Resected and retrieved.                           - Diverticulosis in the sigmoid colon and in the                            descending colon.                           - Non-bleeding internal hemorrhoids. Recommendation:           - Patient has a contact number available for                            emergencies. The signs and symptoms of potential                            delayed complications were discussed with the                             patient. Return to normal activities tomorrow.                            Written discharge instructions were provided to the                            patient.                           - Resume previous diet.                           - Continue present medications.                           - Await pathology results.                           - Repeat colonoscopy in 5-10 years for surveillance                            based on pathology results. Mauri Pole, MD 03/19/2017 12:07:31 PM This report has been signed electronically.

## 2017-03-20 ENCOUNTER — Telehealth: Payer: Self-pay

## 2017-03-20 NOTE — Telephone Encounter (Signed)
   Follow up Call-  Call back number 03/19/2017  Post procedure Call Back phone  # [phone]  Permission to leave phone message Yes  Some recent data might be hidden     Left message

## 2017-03-20 NOTE — Telephone Encounter (Signed)
   Follow up Call-  Call back number 03/19/2017  Post procedure Call Back phone  # [phone]  Permission to leave phone message Yes  Some recent data might be hidden     Left message

## 2017-03-25 ENCOUNTER — Encounter: Payer: Self-pay | Admitting: Gastroenterology

## 2017-07-25 ENCOUNTER — Encounter: Payer: Self-pay | Admitting: Family Medicine

## 2017-10-16 ENCOUNTER — Other Ambulatory Visit: Payer: Self-pay | Admitting: Family Medicine

## 2017-10-16 DIAGNOSIS — Z1231 Encounter for screening mammogram for malignant neoplasm of breast: Secondary | ICD-10-CM

## 2017-11-17 ENCOUNTER — Ambulatory Visit: Payer: Managed Care, Other (non HMO)

## 2017-11-18 ENCOUNTER — Encounter: Payer: Self-pay | Admitting: Family Medicine

## 2017-12-01 ENCOUNTER — Other Ambulatory Visit: Payer: Self-pay

## 2017-12-04 ENCOUNTER — Ambulatory Visit
Admission: RE | Admit: 2017-12-04 | Discharge: 2017-12-04 | Disposition: A | Discharge status: Home / Self Care | Payer: Managed Care, Other (non HMO) | Source: Ambulatory Visit | Attending: Family Medicine | Admitting: Family Medicine

## 2017-12-04 DIAGNOSIS — Z1231 Encounter for screening mammogram for malignant neoplasm of breast: Secondary | ICD-10-CM

## 2018-02-23 ENCOUNTER — Ambulatory Visit (INDEPENDENT_AMBULATORY_CARE_PROVIDER_SITE_OTHER): Payer: Managed Care, Other (non HMO) | Admitting: Family Medicine

## 2018-02-23 ENCOUNTER — Encounter: Payer: Self-pay | Admitting: Family Medicine

## 2018-02-23 VITALS — BP 136/88 | HR 78 | Temp 98.1°F | Ht 64.0 in | Wt 199.0 lb

## 2018-02-23 DIAGNOSIS — Z0000 Encounter for general adult medical examination without abnormal findings: Secondary | ICD-10-CM

## 2018-02-23 DIAGNOSIS — G43009 Migraine without aura, not intractable, without status migrainosus: Secondary | ICD-10-CM

## 2018-02-23 DIAGNOSIS — J45990 Exercise induced bronchospasm: Secondary | ICD-10-CM

## 2018-02-23 DIAGNOSIS — R011 Cardiac murmur, unspecified: Secondary | ICD-10-CM

## 2018-02-23 DIAGNOSIS — J45901 Unspecified asthma with (acute) exacerbation: Secondary | ICD-10-CM

## 2018-02-23 LAB — CBC WITH DIFFERENTIAL/PLATELET
BASOS PCT: 1.1 % (ref 0.0–3.0)
Basophils Absolute: 0.1 10*3/uL (ref 0.0–0.1)
EOS ABS: 0.4 10*3/uL (ref 0.0–0.7)
EOS PCT: 7.9 % — AB (ref 0.0–5.0)
HCT: 36.8 % (ref 36.0–46.0)
Hemoglobin: 12.6 g/dL (ref 12.0–15.0)
LYMPHS ABS: 2.4 10*3/uL (ref 0.7–4.0)
Lymphocytes Relative: 46.8 % — ABNORMAL HIGH (ref 12.0–46.0)
MCHC: 34.2 g/dL (ref 30.0–36.0)
MCV: 91.2 fl (ref 78.0–100.0)
Monocytes Absolute: 0.3 10*3/uL (ref 0.1–1.0)
Monocytes Relative: 5.7 % (ref 3.0–12.0)
NEUTROS ABS: 2 10*3/uL (ref 1.4–7.7)
Neutrophils Relative %: 38.5 % — ABNORMAL LOW (ref 43.0–77.0)
PLATELETS: 361 10*3/uL (ref 150.0–400.0)
RBC: 4.04 Mil/uL (ref 3.87–5.11)
RDW: 13.2 % (ref 11.5–15.5)
WBC: 5.1 10*3/uL (ref 4.0–10.5)

## 2018-02-23 LAB — POCT URINALYSIS DIPSTICK
BILIRUBIN UA: NEGATIVE
GLUCOSE UA: NEGATIVE
KETONES UA: NEGATIVE
Leukocytes, UA: NEGATIVE
Nitrite, UA: NEGATIVE
Odor: NEGATIVE
PH UA: 8 (ref 5.0–8.0)
Spec Grav, UA: 1.015 (ref 1.010–1.025)
UROBILINOGEN UA: 0.2 U/dL

## 2018-02-23 LAB — HEPATIC FUNCTION PANEL
ALK PHOS: 50 U/L (ref 39–117)
ALT: 9 U/L (ref 0–35)
AST: 15 U/L (ref 0–37)
Albumin: 4 g/dL (ref 3.5–5.2)
BILIRUBIN TOTAL: 0.9 mg/dL (ref 0.2–1.2)
Bilirubin, Direct: 0.1 mg/dL (ref 0.0–0.3)
Total Protein: 6.6 g/dL (ref 6.0–8.3)

## 2018-02-23 LAB — LIPID PANEL
CHOL/HDL RATIO: 3
Cholesterol: 201 mg/dL — ABNORMAL HIGH (ref 0–200)
HDL: 62.1 mg/dL (ref >39.00)
LDL CALC: 121 mg/dL — AB (ref 0–99)
NonHDL: 138.86
Triglycerides: 89 mg/dL (ref 0.0–149.0)
VLDL: 17.8 mg/dL (ref 0.0–40.0)

## 2018-02-23 LAB — BASIC METABOLIC PANEL
BUN: 10 mg/dL (ref 6–23)
CALCIUM: 9.2 mg/dL (ref 8.4–10.5)
CO2: 29 mEq/L (ref 19–32)
Chloride: 103 mEq/L (ref 96–112)
Creatinine, Ser: 0.8 mg/dL (ref 0.40–1.20)
GFR: 97.22 mL/min (ref >60.00)
GLUCOSE: 95 mg/dL (ref 70–99)
POTASSIUM: 4.3 meq/L (ref 3.5–5.1)
SODIUM: 139 meq/L (ref 135–145)

## 2018-02-23 LAB — TSH: TSH: 1.09 u[IU]/mL (ref 0.35–4.50)

## 2018-02-23 MED ORDER — ALBUTEROL SULFATE HFA 108 (90 BASE) MCG/ACT IN AERS: INHALATION_SPRAY | RESPIRATORY_TRACT | 2 refills | Status: DC

## 2018-02-23 NOTE — Patient Instructions (Signed)
Labs today....... I will call you if anything abnormal  Continue good diet and exercise program  Follow-up in one year for general physical exam sooner if any problems

## 2018-02-23 NOTE — Progress Notes (Signed)
Tiffany Beltran is a 52 year old single female nonsmoker who comes in today for general physical examination because of a history of migraine headaches exercise-induced asthma  In the past 12 months she has had no migraine headaches  She has a history of exercise-induced asthma. When she runs or gets a cold she'll take 1 or 2 puffs of albuterol  She gets routine eye care, dental care, BSE monthly at home, and you mammography, colonoscopy 2018 normal  Vaccinations up-to-date  LMP 4 weeks ago normal. In the last 12 much she skipped wants. No menopausal symptoms date  Family history unchanged......Tiffany Beltran both her mother and sister now have hypertension on medication. Her BP at home runs 130/80. 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 She's lost 7 pounds in the last year be a diet and exercise. She still drinks sweet tea.  BP 136/88 (BP Location: Left Arm, Patient Position: Sitting, Cuff Size: Large)   Pulse 78   Temp 98.1 F (36.7 C) (Oral)   Ht 5\' 4"  (1.626 m)   Wt 199 lb (90.3 kg)   BMI 34.16 kg/m  Well-developed well-nourished female no acute distress vital signs stable she's afebrile HEENT were negative neck was supple thyroid is not enlarged cardiopulmonary exam normal breast exam normal. Abdominal exam normal. Pelvic and rectal not indicated from his normal skin normal peripheral pulses normal except for skin tag left axillary area and seborrheic keratoses 4 on the right lateral breast area.  #1 healthy female  #2 migraine headaches........ decreased over time  #3 exercise-induced asthma........Tiffany Beltran refill albuterol.

## 2018-03-03 ENCOUNTER — Encounter: Payer: Self-pay | Admitting: Family Medicine

## 2018-08-26 ENCOUNTER — Encounter: Payer: Self-pay | Admitting: Family Medicine

## 2018-08-28 ENCOUNTER — Ambulatory Visit (INDEPENDENT_AMBULATORY_CARE_PROVIDER_SITE_OTHER): Payer: Managed Care, Other (non HMO)

## 2018-08-28 DIAGNOSIS — Z23 Encounter for immunization: Secondary | ICD-10-CM | POA: Diagnosis not present

## 2019-01-04 ENCOUNTER — Other Ambulatory Visit: Payer: Self-pay | Admitting: Family Medicine

## 2019-01-04 DIAGNOSIS — Z1231 Encounter for screening mammogram for malignant neoplasm of breast: Secondary | ICD-10-CM

## 2019-01-06 ENCOUNTER — Other Ambulatory Visit: Payer: Self-pay | Admitting: Obstetrics and Gynecology

## 2019-01-06 ENCOUNTER — Ambulatory Visit
Admission: RE | Admit: 2019-01-06 | Discharge: 2019-01-06 | Disposition: A | Discharge status: Home / Self Care | Payer: 59 | Source: Ambulatory Visit

## 2019-01-06 ENCOUNTER — Other Ambulatory Visit: Payer: Self-pay | Admitting: Family Medicine

## 2019-01-06 DIAGNOSIS — Z1231 Encounter for screening mammogram for malignant neoplasm of breast: Secondary | ICD-10-CM

## 2019-08-11 ENCOUNTER — Ambulatory Visit (INDEPENDENT_AMBULATORY_CARE_PROVIDER_SITE_OTHER): Payer: 59 | Admitting: Family Medicine

## 2019-08-11 ENCOUNTER — Encounter: Payer: Self-pay | Admitting: Family Medicine

## 2019-08-11 ENCOUNTER — Other Ambulatory Visit: Payer: Self-pay

## 2019-08-11 ENCOUNTER — Other Ambulatory Visit (HOSPITAL_COMMUNITY)
Admission: RE | Admit: 2019-08-11 | Discharge: 2019-08-11 | Disposition: A | Discharge status: Home / Self Care | Payer: 59 | Source: Ambulatory Visit | Attending: Family Medicine | Admitting: Family Medicine

## 2019-08-11 VITALS — BP 146/92 | HR 74 | Temp 97.2°F | Resp 16 | Ht 64.0 in | Wt 195.6 lb

## 2019-08-11 DIAGNOSIS — Z124 Encounter for screening for malignant neoplasm of cervix: Secondary | ICD-10-CM | POA: Diagnosis present

## 2019-08-11 DIAGNOSIS — Z23 Encounter for immunization: Secondary | ICD-10-CM

## 2019-08-11 DIAGNOSIS — Z0000 Encounter for general adult medical examination without abnormal findings: Secondary | ICD-10-CM | POA: Insufficient documentation

## 2019-08-11 DIAGNOSIS — R030 Elevated blood-pressure reading, without diagnosis of hypertension: Secondary | ICD-10-CM

## 2019-08-11 LAB — CBC WITH DIFFERENTIAL/PLATELET
Basophils Absolute: 0.1 10*3/uL (ref 0.0–0.1)
Basophils Relative: 1 % (ref 0.0–3.0)
Eosinophils Absolute: 0.4 10*3/uL (ref 0.0–0.7)
Eosinophils Relative: 6.3 % — ABNORMAL HIGH (ref 0.0–5.0)
HCT: 36.8 % (ref 36.0–46.0)
Hemoglobin: 12.3 g/dL (ref 12.0–15.0)
Lymphocytes Relative: 42.1 % (ref 12.0–46.0)
Lymphs Abs: 2.5 10*3/uL (ref 0.7–4.0)
MCHC: 33.4 g/dL (ref 30.0–36.0)
MCV: 91.7 fl (ref 78.0–100.0)
Monocytes Absolute: 0.3 10*3/uL (ref 0.1–1.0)
Monocytes Relative: 5.6 % (ref 3.0–12.0)
Neutro Abs: 2.7 10*3/uL (ref 1.4–7.7)
Neutrophils Relative %: 45 % (ref 43.0–77.0)
Platelets: 367 10*3/uL (ref 150.0–400.0)
RBC: 4.01 Mil/uL (ref 3.87–5.11)
RDW: 13.1 % (ref 11.5–15.5)
WBC: 6 10*3/uL (ref 4.0–10.5)

## 2019-08-11 LAB — COMPREHENSIVE METABOLIC PANEL
ALT: 11 U/L (ref 0–35)
AST: 16 U/L (ref 0–37)
Albumin: 4.3 g/dL (ref 3.5–5.2)
Alkaline Phosphatase: 62 U/L (ref 39–117)
BUN: 11 mg/dL (ref 6–23)
CO2: 29 mEq/L (ref 19–32)
Calcium: 9.6 mg/dL (ref 8.4–10.5)
Chloride: 103 mEq/L (ref 96–112)
Creatinine, Ser: 0.84 mg/dL (ref 0.40–1.20)
GFR: 85.97 mL/min (ref >60.00)
Glucose, Bld: 98 mg/dL (ref 70–99)
Potassium: 4 mEq/L (ref 3.5–5.1)
Sodium: 139 mEq/L (ref 135–145)
Total Bilirubin: 0.9 mg/dL (ref 0.2–1.2)
Total Protein: 7.6 g/dL (ref 6.0–8.3)

## 2019-08-11 LAB — LIPID PANEL
Cholesterol: 210 mg/dL — ABNORMAL HIGH (ref 0–200)
HDL: 54.5 mg/dL (ref >39.00)
LDL Cholesterol: 140 mg/dL — ABNORMAL HIGH (ref 0–99)
NonHDL: 155.23
Total CHOL/HDL Ratio: 4
Triglycerides: 76 mg/dL (ref 0.0–149.0)
VLDL: 15.2 mg/dL (ref 0.0–40.0)

## 2019-08-11 NOTE — Patient Instructions (Addendum)
Please return in 4 weeks to recheck your blood pressure.  Please check your blood pressure at home 3-4x/ week and log the numbers for my review.  Work on eating a healthy low salt diet. If you can lose a few pounds, that will improve your blood pressure.   I will release your lab results to you on your MyChart account with further instructions. Please reply with any questions.   Today you were given your flu vaccination.    It was a pleasure meeting you today! Thank you for choosing [place] to meet your healthcare needs! I truly look forward to working with you. If you have any questions or concerns, please send me a message via Mychart or call the office at [phone].  Please do these things to maintain good health!   Exercise at least 30-45 minutes a day,  4-5 days a week.   Eat a low-fat diet with lots of fruits and vegetables, up to 7-9 servings per day.  Drink plenty of water daily. Try to drink 8 8oz glasses per day.  Seatbelts can save your life. Always wear your seatbelt.  Place Smoke Detectors on every level of your home and check batteries every year.  Schedule an appointment with an eye doctor for an eye exam every 1-2 years  Safe sex - use condoms to protect yourself from STDs if you could be exposed to these types of infections. Use birth control if you do not want to become pregnant and are sexually active.  Avoid heavy alcohol use. If you drink, keep it to less than 2 drinks/day and not every day.  [place].  Choose someone you trust that could speak for you if you became unable to speak for yourself.  Depression is common in our stressful world.If you're feeling down or losing interest in things you normally enjoy, please come in for a visit.  If anyone is threatening or hurting you, please get help. Physical or Emotional Violence is never OK.    DASH Eating Plan DASH stands for "Dietary Approaches to Stop Hypertension." The DASH eating plan is  a healthy eating plan that has been shown to reduce high blood pressure (hypertension). It may also reduce your risk for type 2 diabetes, heart disease, and stroke. The DASH eating plan may also help with weight loss. What are tips for following this plan?  General guidelines  Avoid eating more than 2,300 mg (milligrams) of salt (sodium) a day. If you have hypertension, you may need to reduce your sodium intake to 1,500 mg a day.  Limit alcohol intake to no more than 1 drink a day for nonpregnant women and 2 drinks a day for men. One drink equals 12 oz of beer, 5 oz of wine, or 1 oz of hard liquor.  Work with your health care provider to maintain a healthy body weight or to lose weight. Ask what an ideal weight is for you.  Get at least 30 minutes of exercise that causes your heart to beat faster (aerobic exercise) most days of the week. Activities may include walking, swimming, or biking.  Work with your health care provider or diet and nutrition specialist (dietitian) to adjust your eating plan to your individual calorie needs. Reading food labels   Check food labels for the amount of sodium per serving. Choose foods with less than 5 percent of the Daily Value of sodium. Generally, foods with less than 300 mg of sodium per serving fit into  this eating plan.  To find whole grains, look for the word "whole" as the first word in the ingredient list. Shopping  Buy products labeled as "low-sodium" or "no salt added."  Buy fresh foods. Avoid canned foods and premade or frozen meals. Cooking  Avoid adding salt when cooking. Use salt-free seasonings or herbs instead of table salt or sea salt. Check with your health care provider or pharmacist before using salt substitutes.  Do not fry foods. Cook foods using healthy methods such as baking, boiling, grilling, and broiling instead.  Cook with heart-healthy oils, such as olive, canola, soybean, or sunflower oil. Meal planning  Eat a  balanced diet that includes: ? 5 or more servings of fruits and vegetables each day. At each meal, try to fill half of your plate with fruits and vegetables. ? Up to 6-8 servings of whole grains each day. ? Less than 6 oz of lean meat, poultry, or fish each day. A 3-oz serving of meat is about the same size as a deck of cards. One egg equals 1 oz. ? 2 servings of low-fat dairy each day. ? A serving of nuts, seeds, or beans 5 times each week. ? Heart-healthy fats. Healthy fats called Omega-3 fatty acids are found in foods such as flaxseeds and coldwater fish, like sardines, salmon, and mackerel.  Limit how much you eat of the following: ? Canned or prepackaged foods. ? Food that is high in trans fat, such as fried foods. ? Food that is high in saturated fat, such as fatty meat. ? Sweets, desserts, sugary drinks, and other foods with added sugar. ? Full-fat dairy products.  Do not salt foods before eating.  Try to eat at least 2 vegetarian meals each week.  Eat more home-cooked food and less restaurant, buffet, and fast food.  When eating at a restaurant, ask that your food be prepared with less salt or no salt, if possible. What foods are recommended? The items listed may not be a complete list. Talk with your dietitian about what dietary choices are best for you. Grains Whole-grain or whole-wheat bread. Whole-grain or whole-wheat pasta. Brown rice. [place]. Bulgur. Whole-grain and low-sodium cereals. Pita bread. Low-fat, low-sodium crackers. Whole-wheat flour tortillas. Vegetables Fresh or frozen vegetables (raw, steamed, roasted, or grilled). Low-sodium or reduced-sodium tomato and vegetable juice. Low-sodium or reduced-sodium tomato sauce and tomato paste. Low-sodium or reduced-sodium canned vegetables. Fruits All fresh, dried, or frozen fruit. Canned fruit in natural juice (without added sugar). Meat and other protein foods Skinless chicken or [place]. Ground chicken or  [place]. Pork with fat trimmed off. Fish and seafood. Egg whites. Dried beans, peas, or lentils. Unsalted nuts, nut butters, and seeds. Unsalted canned beans. Lean cuts of beef with fat trimmed off. Low-sodium, lean deli meat. Dairy Low-fat (1%) or fat-free (skim) milk. Fat-free, low-fat, or reduced-fat cheeses. Nonfat, low-sodium ricotta or cottage cheese. Low-fat or nonfat yogurt. Low-fat, low-sodium cheese. Fats and oils Soft margarine without trans fats. Vegetable oil. Low-fat, reduced-fat, or light mayonnaise and salad dressings (reduced-sodium). Canola, safflower, olive, soybean, and sunflower oils. Avocado. Seasoning and other foods Herbs. Spices. Seasoning mixes without salt. Unsalted popcorn and pretzels. Fat-free sweets. What foods are not recommended? The items listed may not be a complete list. Talk with your dietitian about what dietary choices are best for you. Grains Baked goods made with fat, such as croissants, muffins, or some breads. Dry pasta or rice meal packs. Vegetables Creamed or fried vegetables. Vegetables in a cheese sauce.  Regular canned vegetables (not low-sodium or reduced-sodium). Regular canned tomato sauce and paste (not low-sodium or reduced-sodium). Regular tomato and vegetable juice (not low-sodium or reduced-sodium). Angie Fava. Olives. Fruits Canned fruit in a light or heavy syrup. Fried fruit. Fruit in cream or butter sauce. Meat and other protein foods Fatty cuts of meat. Ribs. Fried meat. Berniece Salines. Sausage. Bologna and other processed lunch meats. Salami. Fatback. Hotdogs. Bratwurst. Salted nuts and seeds. Canned beans with added salt. Canned or smoked fish. Whole eggs or egg yolks. Chicken or [place] with skin. Dairy Whole or 2% milk, cream, and half-and-half. Whole or full-fat cream cheese. Whole-fat or sweetened yogurt. Full-fat cheese. Nondairy creamers. Whipped toppings. Processed cheese and cheese spreads. Fats and oils Butter. Stick margarine. Lard.  Shortening. Ghee. Bacon fat. Tropical oils, such as coconut, palm kernel, or palm oil. Seasoning and other foods Salted popcorn and pretzels. Onion salt, garlic salt, seasoned salt, table salt, and sea salt. Worcestershire sauce. Tartar sauce. Barbecue sauce. Teriyaki sauce. Soy sauce, including reduced-sodium. Steak sauce. Canned and packaged gravies. Fish sauce. Oyster sauce. Cocktail sauce. Horseradish that you find on the shelf. Ketchup. Mustard. Meat flavorings and tenderizers. Bouillon cubes. Hot sauce and Tabasco sauce. Premade or packaged marinades. Premade or packaged taco seasonings. Relishes. Regular salad dressings. Where to find more information:  National Heart, Lung, and [place]: https://wilson-eaton.com/  American Heart Association: www.heart.org Summary  The DASH eating plan is a healthy eating plan that has been shown to reduce high blood pressure (hypertension). It may also reduce your risk for type 2 diabetes, heart disease, and stroke.  With the DASH eating plan, you should limit salt (sodium) intake to 2,300 mg a day. If you have hypertension, you may need to reduce your sodium intake to 1,500 mg a day.  When on the DASH eating plan, aim to eat more fresh fruits and vegetables, whole grains, lean proteins, low-fat dairy, and heart-healthy fats.  Work with your health care provider or diet and nutrition specialist (dietitian) to adjust your eating plan to your individual calorie needs. This information is not intended to replace advice given to you by your health care provider. Make sure you discuss any questions you have with your health care provider. Document Released: 10/10/2011 Document Revised: 10/03/2017 Document Reviewed: 10/14/2016 Elsevier Patient Education  2020 [employer].

## 2019-08-11 NOTE — Progress Notes (Signed)
Subjective  Chief Complaint  Patient presents with  . Transitions Of Care  . Annual Exam    Pap  . Elevated Home BP readings    168/99 last week, has had other high readings... Reports HA (not new), denies dizziness and blurred vision    HPI: Tiffany Beltran is a 53 y.o. female who presents to [facility] [place] today for a Female Wellness Visit as TOC pt. Dr Sherren Mocha is retiring.. She also has the concerns and/or needs as listed above in the chief complaint. These will be addressed in addition to the Health Maintenance Visit.   Wellness Visit: annual visit with health maintenance review and exam with Pap   Tiffany Beltran 53 yo single G0 "work-aholic" who is happy notes increased bps over the last week. No h/o HTN but +FH. Has a home wrist cuff. Feels well. 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 Her sister lives in [place] as well. She is due pap, mammogram and crc screens are normal and up to date. Due flu shot.  Chronic disease f/u and/or acute problem visit: (deemed necessary to be done in addition to the wellness visit):  Elevated bp w/o h/o htn. No sxs. Diet is fair. Would like to lose weight.   Perimenopausal with intermittent cycles now. Rare menopausal sx.   BP Readings from Last 3 Encounters:  08/11/19 (!) 146/92  02/23/18 136/88  03/19/17 (!) 150/85   Wt Readings from Last 3 Encounters:  08/11/19 195 lb 9.6 oz (88.7 kg)  02/23/18 199 lb (90.3 kg)  03/19/17 207 lb (93.9 kg)    Assessment  1. Annual physical exam   2. Cervical cancer screening   3. Elevated blood pressure reading without diagnosis of hypertension   4. Need for immunization against influenza      Plan  Female Wellness Visit:  Age appropriate Health Maintenance and Prevention measures were discussed with patient. Included topics are cancer screening recommendations,  ways to keep healthy (see AVS) including dietary and exercise recommendations, regular eye and dental care, use of seat belts, and avoidance of moderate alcohol use and tobacco use. Pap smear with HR HPV today  BMI: discussed patient's BMI and encouraged positive lifestyle modifications to help get to or maintain a target BMI.  HM needs and immunizations were addressed and ordered. See below for orders. See HM and immunization section for updates. Flu today  Routine labs and screening tests ordered including cmp, cbc and lipids where appropriate.  Discussed recommendations regarding Vit D and calcium supplementation (see AVS)  Chronic disease management visit and/or acute problem visit:  Elevated bp: education and counseling done. Need more data. To check at home over next four weeks and improve diet. See avs. Work on weight loss. Recheck 4 weeks. To start meds at that time if remains elevated.   Follow up: Return in about 4 weeks (around 09/08/2019) for recheck blood pressure.  Orders Placed This Encounter  Procedures  . Flu Vaccine QUAD 36+ mos IM  . CBC with Differential/Platelet  . Comprehensive metabolic panel  . Lipid panel  . HIV Antibody (routine testing w rflx)   No orders of the defined types were placed in this encounter.     Lifestyle: Body mass index is 33.57 kg/m. Wt Readings from Last 3 Encounters:  08/11/19 195 lb 9.6 oz (  88.7 kg)  02/23/18 199 lb (90.3 kg)  03/19/17 207 lb (93.9 kg)     Patient Active Problem List   Diagnosis Date Noted  . Exercise-induced asthma 02/18/2017  . Migraine without aura 06/19/2007    Qualifier: Diagnosis of  By: Cari Caraway RN, [place] Maintenance  Topic Date Due  . HIV Screening  01/19/1982  . PAP SMEAR-Modifier  03/12/2019  . INFLUENZA VACCINE  06/05/2019  . MAMMOGRAM  01/06/2020  . TETANUS/TDAP  11/15/2024  . COLONOSCOPY  03/20/2027   Immunization History  Administered Date(s) Administered  .  Influenza Inj Mdck Quad Pf 08/21/2017  . Influenza,inj,Quad PF,6+ Mos 08/28/2018, 08/11/2019  . Influenza-Unspecified 11/14/2017  . Td 11/05/2003  . Tdap 11/15/2014   We updated and reviewed the patient's past history in detail and it is documented below. Allergies: Patient has No Known Allergies. Past Medical History Patient  has a past medical history of Allergy, Anemia, Arthritis, Asthma, Headache(784.0), Migraines, and Undiagnosed cardiac murmurs. Past Surgical History Patient  has no past surgical history on file. Family History: Patient family history includes Breast cancer (age of onset: 69) in her sister; Glaucoma in her mother; Hypertension in an other family member. Social History:  Patient  reports that she has never smoked. She has never used smokeless tobacco. She reports that she does not drink alcohol or use drugs.  Review of Systems: Constitutional: negative for fever or malaise Ophthalmic: negative for photophobia, double vision or loss of vision Cardiovascular: negative for chest pain, dyspnea on exertion, or new LE swelling Respiratory: negative for SOB or persistent cough Gastrointestinal: negative for abdominal pain, change in bowel habits or melena Genitourinary: negative for dysuria or gross hematuria, no abnormal uterine bleeding or disharge Musculoskeletal: negative for new gait disturbance or muscular weakness Integumentary: negative for new or persistent rashes, no breast lumps Neurological: negative for TIA or stroke symptoms Psychiatric: negative for SI or delusions Allergic/Immunologic: negative for hives  Patient Care Team    Relationship Specialty Notifications Start End  Leamon Arnt, MD PCP - General Family Medicine  08/11/19     Objective  Vitals: BP (!) 146/92   Pulse 74   Temp (!) 97.2 F (36.2 C) (Tympanic)   Resp 16   Ht 5\' 4"  (1.626 m)   Wt 195 lb 9.6 oz (88.7 kg)   SpO2 98%   BMI 33.57 kg/m  General:  Well developed, well  nourished, no acute distress  Psych:  Alert and orientedx3,normal mood and affect HEENT:  Normocephalic, atraumatic, non-icteric sclera, PERRL, oropharynx is clear without mass or exudate, supple neck without adenopathy, mass or thyromegaly Cardiovascular:  Normal S1, S2, RRR without gallop, rub or murmur, nondisplaced PMI Respiratory:  Good breath sounds bilaterally, CTAB with normal respiratory effort Gastrointestinal: normal bowel sounds, soft, non-tender, no noted masses. No HSM MSK: no deformities, contusions. Joints are without erythema or swelling. Spine and CVA region are nontender Skin:  Warm, no rashes or suspicious lesions noted Neurologic:    Mental status is normal. CN 2-11 are normal. Gross motor and sensory exams are normal. Normal gait. No tremor Breast Exam: No mass, skin retraction or nipple discharge is appreciated in either breast. No axillary adenopathy. Fibrocystic changes are not noted Pelvic Exam: Normal external genitalia, no vulvar or vaginal lesions present. Clear cervix w/o CMT. Bimanual exam reveals a nontender fundus w/o masses, nl size. No adnexal masses present. No inguinal adenopathy. A PAP smear was performed.    Commons  side effects, risks, benefits, and alternatives for medications and treatment plan prescribed today were discussed, and the patient expressed understanding of the given instructions. Patient is instructed to call or message via MyChart if he/she has any questions or concerns regarding our treatment plan. No barriers to understanding were identified. We discussed Red Flag symptoms and signs in detail. Patient expressed understanding regarding what to do in case of urgent or emergency type symptoms.   Medication list was reconciled, printed and provided to the patient in AVS. Patient instructions and summary information was reviewed with the patient as documented in the AVS. This note was prepared with assistance of Dragon voice recognition software.  Occasional wrong-word or sound-a-like substitutions may have occurred due to the inherent limitations of voice recognition software

## 2019-08-12 LAB — HIV ANTIBODY (ROUTINE TESTING W REFLEX): HIV 1&2 Ab, 4th Generation: NONREACTIVE

## 2019-08-23 LAB — CYTOLOGY - PAP
Comment: NEGATIVE
Diagnosis: NEGATIVE
High risk HPV: NEGATIVE

## 2019-09-08 ENCOUNTER — Ambulatory Visit (INDEPENDENT_AMBULATORY_CARE_PROVIDER_SITE_OTHER): Payer: 59 | Admitting: Family Medicine

## 2019-09-08 ENCOUNTER — Encounter: Payer: Self-pay | Admitting: Family Medicine

## 2019-09-08 ENCOUNTER — Other Ambulatory Visit: Payer: Self-pay

## 2019-09-08 VITALS — BP 138/82 | HR 79 | Temp 97.5°F | Ht 64.0 in | Wt 193.1 lb

## 2019-09-08 DIAGNOSIS — R030 Elevated blood-pressure reading, without diagnosis of hypertension: Secondary | ICD-10-CM | POA: Diagnosis not present

## 2019-09-08 NOTE — Patient Instructions (Signed)
Please return in 6 months for follow up of your hypertension.   If you have any questions or concerns, please don't hesitate to send me a message via MyChart or call the office at [phone]. Thank you for visiting with [place] today! It's our pleasure caring for you.

## 2019-09-08 NOTE — Progress Notes (Signed)
Subjective  CC:  Chief Complaint  Patient presents with  . Hypertension    HPI: Tiffany Beltran is a 53 y.o. female who presents to the office today to address the problems listed above in the chief complaint. Hypertension f/u: here to recheck bp; home readings remain high but cuff checked against ours today and shows is inaccurate. Cuff reads 160/96 while our cuff reads normal. She feels well. Working on low sodium low fat diet.   Assessment  1. Elevated blood pressure reading without diagnosis of hypertension      Plan    bp f/u: prehypertension; work on weight loss and low sodium diet. Stop using home cuff or replace it. Recheck 6 months.   Education regarding management of these chronic disease states was given. Management strategies discussed on successive visits include dietary and exercise recommendations, goals of achieving and maintaining IBW, and lifestyle modifications aiming for adequate sleep and minimizing stressors.   Follow up: Return in about 6 months (around 03/07/2020) for follow up Hypertension.  No orders of the defined types were placed in this encounter.  No orders of the defined types were placed in this encounter.     BP Readings from Last 3 Encounters:  09/08/19 138/82  08/11/19 (!) 146/92  02/23/18 136/88   Wt Readings from Last 3 Encounters:  09/08/19 193 lb 1 oz (87.6 kg)  08/11/19 195 lb 9.6 oz (88.7 kg)  02/23/18 199 lb (90.3 kg)    Lab Results  Component Value Date   CHOL 210 (H) 08/11/2019   CHOL 201 (H) 02/23/2018   CHOL 196 02/18/2017   Lab Results  Component Value Date   HDL 54.50 08/11/2019   HDL 62.10 02/23/2018   HDL 59.80 02/18/2017   Lab Results  Component Value Date   LDLCALC 140 (H) 08/11/2019   LDLCALC 121 (H) 02/23/2018   LDLCALC 125 (H) 02/18/2017   Lab Results  Component Value Date   TRIG 76.0 08/11/2019   TRIG 89.0 02/23/2018   TRIG 55.0 02/18/2017   Lab Results  Component Value Date   CHOLHDL 4 08/11/2019    CHOLHDL 3 02/23/2018   CHOLHDL 3 02/18/2017   No results found for: LDLDIRECT Lab Results  Component Value Date   CREATININE 0.84 08/11/2019   BUN 11 08/11/2019   NA 139 08/11/2019   K 4.0 08/11/2019   CL 103 08/11/2019   CO2 29 08/11/2019    The 10-year ASCVD risk score Mikey Bussing DC Jr., et al., 2013) is: 3.3%   Values used to calculate the score:     Age: 20 years     Sex: Female     Is Non-Hispanic African American: Yes     Diabetic: No     Tobacco smoker: No     Systolic Blood Pressure: 0000000 mmHg     Is BP treated: No     HDL Cholesterol: 54.5 mg/dL     Total Cholesterol: 210 mg/dL  I reviewed the patients updated PMH, FH, and SocHx.    Patient Active Problem List   Diagnosis Date Noted  . Exercise-induced asthma 02/18/2017  . Migraine without aura 06/19/2007    Allergies: Patient has no known allergies.  Social History: Patient  reports that she has never smoked. She has never used smokeless tobacco. She reports that she does not drink alcohol or use drugs.  Current Meds  Medication Sig  . albuterol (PROVENTIL HFA;VENTOLIN HFA) 108 (90 Base) MCG/ACT inhaler One puff 30 minutes prior to  exercise  . aspirin 81 MG tablet Take 81 mg by mouth daily.    . Multiple Vitamin (MULTIVITAMIN) tablet Take 1 tablet by mouth daily.    . rizatriptan (MAXALT) 5 MG tablet Take 1 tablet (5 mg total) by mouth as needed. May repeat in 2 hours if needed    Review of Systems: Cardiovascular: negative for chest pain, palpitations, leg swelling, orthopnea Respiratory: negative for SOB, wheezing or persistent cough Gastrointestinal: negative for abdominal pain Genitourinary: negative for dysuria or gross hematuria  Objective  Vitals: BP 138/82   Pulse 79   Temp (!) 97.5 F (36.4 C) (Tympanic)   Ht 5\' 4"  (1.626 m)   Wt 193 lb 1 oz (87.6 kg)   LMP 07/12/2019   SpO2 98%   BMI 33.14 kg/m  General: no acute distress  Psych:  Alert and oriented, normal mood and affect HEENT:   Normocephalic, atraumatic, supple neck  Cardiovascular:  RRR without murmur. no edema Respiratory:  Good breath sounds bilaterally, CTAB with normal respiratory effort   Commons side effects, risks, benefits, and alternatives for medications and treatment plan prescribed today were discussed, and the patient expressed understanding of the given instructions. Patient is instructed to call or message via MyChart if he/she has any questions or concerns regarding our treatment plan. No barriers to understanding were identified. We discussed Red Flag symptoms and signs in detail. Patient expressed understanding regarding what to do in case of urgent or emergency type symptoms.   Medication list was reconciled, printed and provided to the patient in AVS. Patient instructions and summary information was reviewed with the patient as documented in the AVS. This note was prepared with assistance of Dragon voice recognition software. Occasional wrong-word or sound-a-like substitutions may have occurred due to the inherent limitations of voice recognition software

## 2020-01-05 ENCOUNTER — Other Ambulatory Visit: Payer: Self-pay | Admitting: Family Medicine

## 2020-01-05 DIAGNOSIS — Z1231 Encounter for screening mammogram for malignant neoplasm of breast: Secondary | ICD-10-CM

## 2020-01-20 ENCOUNTER — Other Ambulatory Visit: Payer: Self-pay

## 2020-01-20 ENCOUNTER — Ambulatory Visit
Admission: RE | Admit: 2020-01-20 | Discharge: 2020-01-20 | Disposition: A | Discharge status: Home / Self Care | Payer: BC Managed Care – PPO | Source: Ambulatory Visit

## 2020-01-20 DIAGNOSIS — Z1231 Encounter for screening mammogram for malignant neoplasm of breast: Secondary | ICD-10-CM

## 2020-03-10 ENCOUNTER — Ambulatory Visit: Payer: 59 | Admitting: Family Medicine

## 2020-03-24 ENCOUNTER — Ambulatory Visit: Payer: 59 | Admitting: Family Medicine

## 2020-04-07 ENCOUNTER — Ambulatory Visit: Payer: BC Managed Care – PPO | Admitting: Family Medicine

## 2020-04-19 ENCOUNTER — Ambulatory Visit (INDEPENDENT_AMBULATORY_CARE_PROVIDER_SITE_OTHER): Payer: BC Managed Care – PPO | Admitting: Family Medicine

## 2020-04-19 ENCOUNTER — Encounter: Payer: Self-pay | Admitting: Family Medicine

## 2020-04-19 ENCOUNTER — Other Ambulatory Visit: Payer: Self-pay

## 2020-04-19 VITALS — BP 134/80 | HR 81 | Temp 98.6°F | Resp 16 | Ht 64.0 in | Wt 189.8 lb

## 2020-04-19 DIAGNOSIS — R030 Elevated blood-pressure reading, without diagnosis of hypertension: Secondary | ICD-10-CM

## 2020-04-19 DIAGNOSIS — L918 Other hypertrophic disorders of the skin: Secondary | ICD-10-CM | POA: Diagnosis not present

## 2020-04-19 DIAGNOSIS — F4321 Adjustment disorder with depressed mood: Secondary | ICD-10-CM

## 2020-04-19 NOTE — Patient Instructions (Signed)
Please return in 6 months for your annual complete physical; please come fasting.  Your blood pressure remains normal.   If you have any questions or concerns, please don't hesitate to send me a message via MyChart or call the office at [phone]. Thank you for visiting with [place] today! It's our pleasure caring for you.

## 2020-04-19 NOTE — Progress Notes (Signed)
Subjective  CC:  Chief Complaint  Patient presents with  . Hypertension    not checking at home not on any medications   . Skin Tag    under left arm hurt when she has cycle     HPI: Tiffany Beltran is a 54 y.o. female who presents to the office today to address the problems listed above in the chief complaint.  F/u for blood pressure: No dx of htn but had one elevated reading here in office. Feels well. No cp or sob  Grief: sister passed last week due to metastatic breast cancer. Pt was with her for the last 3 months of her life. Coping well  Skin tags underneat right axilla: ? Needs removed.  Assessment  1. Elevated blood pressure reading without diagnosis of hypertension   2. Grief   3. Skin tag      Plan    Normotensive at  f/u: not HTN at this point. Will monitor.  Grief; normal reaction. Coping well. Counseling.   Skin tags: reassured. No further intervention recommended at this time Education regarding management of these chronic disease states was given. Management strategies discussed on successive visits include dietary and exercise recommendations, goals of achieving and maintaining IBW, and lifestyle modifications aiming for adequate sleep and minimizing stressors.   Follow up: 6 months for cpe  No orders of the defined types were placed in this encounter.  No orders of the defined types were placed in this encounter.     BP Readings from Last 3 Encounters:  04/19/20 134/80  09/08/19 138/82  08/11/19 (!) 146/92   Wt Readings from Last 3 Encounters:  04/19/20 189 lb 12.8 oz (86.1 kg)  09/08/19 193 lb 1 oz (87.6 kg)  08/11/19 195 lb 9.6 oz (88.7 kg)    Lab Results  Component Value Date   CHOL 210 (H) 08/11/2019   CHOL 201 (H) 02/23/2018   CHOL 196 02/18/2017   Lab Results  Component Value Date   HDL 54.50 08/11/2019   HDL 62.10 02/23/2018   HDL 59.80 02/18/2017   Lab Results  Component Value Date   LDLCALC 140 (H) 08/11/2019   LDLCALC 121  (H) 02/23/2018   LDLCALC 125 (H) 02/18/2017   Lab Results  Component Value Date   TRIG 76.0 08/11/2019   TRIG 89.0 02/23/2018   TRIG 55.0 02/18/2017   Lab Results  Component Value Date   CHOLHDL 4 08/11/2019   CHOLHDL 3 02/23/2018   CHOLHDL 3 02/18/2017   No results found for: LDLDIRECT Lab Results  Component Value Date   CREATININE 0.84 08/11/2019   BUN 11 08/11/2019   NA 139 08/11/2019   K 4.0 08/11/2019   CL 103 08/11/2019   CO2 29 08/11/2019    The 10-year ASCVD risk score Mikey Bussing DC Jr., et al., 2013) is: 3.3%   Values used to calculate the score:     Age: 2 years     Sex: Female     Is Non-Hispanic African American: Yes     Diabetic: No     Tobacco smoker: No     Systolic Blood Pressure: 458 mmHg     Is BP treated: No     HDL Cholesterol: 54.5 mg/dL     Total Cholesterol: 210 mg/dL  I reviewed the patients updated PMH, FH, and SocHx.    Patient Active Problem List   Diagnosis Date Noted  . Exercise-induced asthma 02/18/2017  . Migraine without aura 06/19/2007  Allergies: Patient has no known allergies.  Social History: Patient  reports that she has never smoked. She has never used smokeless tobacco. She reports that she does not drink alcohol and does not use drugs.  Current Meds  Medication Sig  . albuterol (PROVENTIL HFA;VENTOLIN HFA) 108 (90 Base) MCG/ACT inhaler One puff 30 minutes prior to exercise  . aspirin 81 MG tablet Take 81 mg by mouth daily.    . Multiple Vitamin (MULTIVITAMIN) tablet Take 1 tablet by mouth daily.    . rizatriptan (MAXALT) 5 MG tablet Take 1 tablet (5 mg total) by mouth as needed. May repeat in 2 hours if needed    Review of Systems: Cardiovascular: negative for chest pain, palpitations, leg swelling, orthopnea Respiratory: negative for SOB, wheezing or persistent cough Gastrointestinal: negative for abdominal pain Genitourinary: negative for dysuria or gross hematuria  Objective  Vitals: BP 134/80   Pulse 81    Temp 98.6 F (37 C) (Temporal)   Resp 16   Ht 5\' 4"  (1.626 m)   Wt 189 lb 12.8 oz (86.1 kg)   SpO2 98%   BMI 32.58 kg/m  General: no acute distress  Psych:  Alert and oriented, normal mood and affect HEENT:  Normocephalic, atraumatic, supple neck  Cardiovascular:  RRR without murmur. no edema Respiratory:  Good breath sounds bilaterally, CTAB with normal respiratory effort Skin:  Warm, several small nonirritated skin tags left axilla Neurologic:   Mental status is normal  Commons side effects, risks, benefits, and alternatives for medications and treatment plan prescribed today were discussed, and the patient expressed understanding of the given instructions. Patient is instructed to call or message via MyChart if he/she has any questions or concerns regarding our treatment plan. No barriers to understanding were identified. We discussed Red Flag symptoms and signs in detail. Patient expressed understanding regarding what to do in case of urgent or emergency type symptoms.   Medication list was reconciled, printed and provided to the patient in AVS. Patient instructions and summary information was reviewed with the patient as documented in the AVS. This note was prepared with assistance of Dragon voice recognition software. Occasional wrong-word or sound-a-like substitutions may have occurred due to the inherent limitations of voice recognition software  This visit occurred during the SARS-CoV-2 public health emergency.  Safety protocols were in place, including screening questions prior to the visit, additional usage of staff PPE, and extensive cleaning of exam room while observing appropriate contact time as indicated for disinfecting solutions.

## 2020-05-30 ENCOUNTER — Encounter: Payer: Self-pay | Admitting: Family Medicine

## 2020-06-08 ENCOUNTER — Ambulatory Visit (INDEPENDENT_AMBULATORY_CARE_PROVIDER_SITE_OTHER): Payer: BC Managed Care – PPO | Admitting: Family Medicine

## 2020-06-08 ENCOUNTER — Ambulatory Visit (INDEPENDENT_AMBULATORY_CARE_PROVIDER_SITE_OTHER)
Admission: RE | Admit: 2020-06-08 | Discharge: 2020-06-08 | Disposition: A | Discharge status: Home / Self Care | Payer: BC Managed Care – PPO | Source: Ambulatory Visit | Attending: Family Medicine | Admitting: Family Medicine

## 2020-06-08 ENCOUNTER — Encounter: Payer: Self-pay | Admitting: Family Medicine

## 2020-06-08 ENCOUNTER — Ambulatory Visit (HOSPITAL_COMMUNITY)
Admission: RE | Admit: 2020-06-08 | Discharge: 2020-06-08 | Disposition: A | Discharge status: Home / Self Care | Payer: BC Managed Care – PPO | Source: Ambulatory Visit | Attending: Family Medicine | Admitting: Family Medicine

## 2020-06-08 ENCOUNTER — Other Ambulatory Visit: Payer: Self-pay

## 2020-06-08 VITALS — BP 130/80 | HR 69 | Temp 97.5°F | Resp 18 | Ht 64.0 in | Wt 187.6 lb

## 2020-06-08 DIAGNOSIS — M47816 Spondylosis without myelopathy or radiculopathy, lumbar region: Secondary | ICD-10-CM | POA: Insufficient documentation

## 2020-06-08 DIAGNOSIS — M5442 Lumbago with sciatica, left side: Secondary | ICD-10-CM | POA: Diagnosis not present

## 2020-06-08 DIAGNOSIS — M79662 Pain in left lower leg: Secondary | ICD-10-CM | POA: Insufficient documentation

## 2020-06-08 DIAGNOSIS — R1032 Left lower quadrant pain: Secondary | ICD-10-CM

## 2020-06-08 DIAGNOSIS — I878 Other specified disorders of veins: Secondary | ICD-10-CM | POA: Diagnosis not present

## 2020-06-08 DIAGNOSIS — M545 Low back pain: Secondary | ICD-10-CM | POA: Diagnosis not present

## 2020-06-08 MED ORDER — PREDNISONE 20 MG PO TABS: ORAL_TABLET | ORAL | 0 refills | Status: DC

## 2020-06-08 MED ORDER — DICLOFENAC SODIUM 50 MG PO TBEC: 50.0000 mg | DELAYED_RELEASE_TABLET | Freq: Two times a day (BID) | ORAL | 0 refills | Status: DC

## 2020-06-08 NOTE — Patient Instructions (Addendum)
Please return in 3-4 weeks for recheck. Sooner if things worsen.   Take the prednisone as prescribed, then can restart the diclofenac IF needed as needed for pain.   Please go to our [facility] office to get your xrays done. You can walk in M-F between 8:30am- noon or 1pm - 5pm. Tell them you are there for xrays ordered by me. They will send me the results, then I will let you know the results with instructions.   Address: [address].  The Xray department is located in the basement.   We will also be calling you to schedule your ultrasound of your left leg.   If you have any questions or concerns, please don't hesitate to send me a message via MyChart or call the office at [phone]. Thank you for visiting with [place] today! It's our pleasure caring for you.

## 2020-06-08 NOTE — Progress Notes (Signed)
Lower extremity venous has been completed.   Preliminary results in CV Proc.   Abram Sander 06/08/2020 1:09 PM

## 2020-06-08 NOTE — Progress Notes (Signed)
Subjective  CC:  Chief Complaint  Patient presents with  . Left leg pain    Pain is mostly from her left knee down. Pain gets worse throughout the day. She is unable to sleep on her left side. Her left leg becomes restless at bedtime. She has been taking Diclofenac and otc tylenol     HPI: Tiffany Beltran is a 54 y.o. female who presents to the office today to address the problems listed above in the chief complaint.  54 year old female presents for follow-up for low back pain and left leg pain.  I reviewed her notes from the urgent care visit she had on July 25 for same.  She reports that she had sudden onset left leg pain in the middle of the night.  This was associated with left lower back pain.  She thought it was related to muscle cramps and tried multiple conservative therapies without relief.  Pain persisted over the next 24 to 48 hours so she sought care from urgent care.  70 mostly musculoskeletal.  Had significant pain with lying on her back on left side and with sitting at that time.  Pain started at left low back back to the groin and went down entire leg.  She denies weakness, bowel or bladder dysfunction.  She did note some left calf pain and swelling.  Of note she had been walking and jogging for exercise prior to that.  She has no history of back pain.  No recent injuries.  Her pain was improved with Toradol and then a course of diclofenac.  Her pain is better but still present.  Still with radicular symptoms down the left leg.  No weakness.  No GI symptoms, urinary symptoms, gross hematuria, history of kidney stones.  She does drive a lot, living in [place].  Assessment  1. Acute left-sided low back pain with left-sided sciatica   2. Pain of left calf   3. Left groin pain      Plan   Low back pain and left leg pain most consistent with musculoskeletal origin, possible herniated disc/sciatica: Check lumbar x-rays, treat with prednisone then NSAIDs, continue muscle  relaxer as needed.  Stretching, time and recheck in 3 to 4 weeks.  Given left calf pain no history of driving, check venous Dopplers.  Also check left hip x-rays.  Discussed red flag symptoms.  Follow-up if worsens.  Follow up: Return in about 4 weeks (around 07/06/2020) for recheck back and leg pain.   10/19/2020  Orders Placed This Encounter  Procedures  . DG Lumbar Spine Complete  . DG HIP UNILAT W OR W/O PELVIS 2-3 VIEWS LEFT  . VAS [place] LOWER EXTREMITY VENOUS (DVT)   Meds ordered this encounter  Medications  . predniSONE (DELTASONE) 20 MG tablet    Sig: Take 3 tabs daily for 5 days    Dispense:  15 tablet    Refill:  0  . diclofenac (VOLTAREN) 50 MG EC tablet    Sig: Take 1 tablet (50 mg total) by mouth 2 (two) times daily.    Dispense:  60 tablet    Refill:  0      I reviewed the patients updated PMH, FH, and SocHx.    Patient Active Problem List   Diagnosis Date Noted  . Exercise-induced asthma 02/18/2017  . Migraine without aura 06/19/2007   Current Meds  Medication Sig  . acetaminophen (TYLENOL) 500 MG tablet Take 1,000 mg by mouth every 6 (six) hours  as needed.  . diclofenac (VOLTAREN) 50 MG EC tablet Take 1 tablet (50 mg total) by mouth 2 (two) times daily.  . Multiple Vitamin (MULTIVITAMIN) tablet Take 1 tablet by mouth daily.    . [DISCONTINUED] diclofenac (VOLTAREN) 50 MG EC tablet Take 50 mg by mouth 2 (two) times daily.    Allergies: Patient has No Known Allergies. Family History: Patient family history includes Breast cancer (age of onset: 95) in her sister; Glaucoma in her mother; Hypertension in an other family member. Social History:  Patient  reports that she has never smoked. She has never used smokeless tobacco. She reports that she does not drink alcohol and does not use drugs.  Review of Systems: Constitutional: Negative for fever malaise or anorexia Cardiovascular: negative for chest pain Respiratory: negative for SOB or persistent  cough Gastrointestinal: negative for abdominal pain  Objective  Vitals: BP 130/80   Pulse 69   Temp (!) 97.5 F (36.4 C) (Temporal)   Resp 18   Ht 5\' 4"  (1.626 m)   Wt 187 lb 9.6 oz (85.1 kg)   SpO2 97%   BMI 32.20 kg/m  General: Appears mildly uncomfortable sitting in chair with left leg extended, A&Ox3, can get to table easily without assistance Back: Mild asymmetry, mild pain with forward flexion.  This is much improved from her urgent care visit.  Negative seated straight leg raise bilaterally mild left sciatic notch tenderness.  No muscle spasm present Strength: 5 out of 5 bilateral lower extremities Knee reflexes +2 bilaterally, downgoing toes Normal gait Full range of motion bilateral hips     Commons side effects, risks, benefits, and alternatives for medications and treatment plan prescribed today were discussed, and the patient expressed understanding of the given instructions. Patient is instructed to call or message via MyChart if he/she has any questions or concerns regarding our treatment plan. No barriers to understanding were identified. We discussed Red Flag symptoms and signs in detail. Patient expressed understanding regarding what to do in case of urgent or emergency type symptoms.   Medication list was reconciled, printed and provided to the patient in AVS. Patient instructions and summary information was reviewed with the patient as documented in the AVS. This note was prepared with assistance of Dragon voice recognition software. Occasional wrong-word or sound-a-like substitutions may have occurred due to the inherent limitations of voice recognition software  This visit occurred during the SARS-CoV-2 public health emergency.  Safety protocols were in place, including screening questions prior to the visit, additional usage of staff PPE, and extensive cleaning of exam room while observing appropriate contact time as indicated for disinfecting solutions.

## 2020-06-15 ENCOUNTER — Ambulatory Visit (INDEPENDENT_AMBULATORY_CARE_PROVIDER_SITE_OTHER): Payer: BC Managed Care – PPO | Admitting: Family Medicine

## 2020-06-15 ENCOUNTER — Telehealth: Payer: Self-pay | Admitting: Family Medicine

## 2020-06-15 ENCOUNTER — Encounter: Payer: Self-pay | Admitting: Family Medicine

## 2020-06-15 ENCOUNTER — Other Ambulatory Visit: Payer: Self-pay

## 2020-06-15 VITALS — BP 156/86 | HR 91 | Temp 97.7°F | Ht 64.0 in | Wt 183.3 lb

## 2020-06-15 DIAGNOSIS — M5442 Lumbago with sciatica, left side: Secondary | ICD-10-CM

## 2020-06-15 DIAGNOSIS — M461 Sacroiliitis, not elsewhere classified: Secondary | ICD-10-CM | POA: Diagnosis not present

## 2020-06-15 DIAGNOSIS — R350 Frequency of micturition: Secondary | ICD-10-CM | POA: Diagnosis not present

## 2020-06-15 LAB — POCT URINALYSIS DIPSTICK
Bilirubin, UA: NEGATIVE
Blood, UA: POSITIVE
Glucose, UA: NEGATIVE
Ketones, UA: NEGATIVE
Leukocytes, UA: NEGATIVE
Nitrite, UA: NEGATIVE
Protein, UA: POSITIVE — AB
Spec Grav, UA: 1.015 (ref 1.010–1.025)
Urobilinogen, UA: 0.2 E.U./dL
pH, UA: 7.5 (ref 5.0–8.0)

## 2020-06-15 MED ORDER — TRAMADOL HCL 50 MG PO TABS: 50.0000 mg | ORAL_TABLET | Freq: Three times a day (TID) | ORAL | 0 refills | Status: AC | PRN

## 2020-06-15 MED ORDER — GABAPENTIN 300 MG PO CAPS: 300.0000 mg | ORAL_CAPSULE | Freq: Three times a day (TID) | ORAL | 0 refills | Status: DC

## 2020-06-15 MED ORDER — KETOROLAC TROMETHAMINE 60 MG/2ML IM SOLN
60.0000 mg | Freq: Once | INTRAMUSCULAR | Status: AC
  Administered 2020-06-15: 60 mg via INTRAMUSCULAR

## 2020-06-15 NOTE — Telephone Encounter (Signed)
Nurse Assessment Nurse: Wynetta Emery, RN, Baker Janus Date/Time Eilene Ghazi Time): 06/15/2020 7:37:43 AM Confirm and document reason for call. If symptomatic, describe symptoms. ---Tiffany Beltran started with back pain onset 4 weeks seen in UC and f.u with MD in office last week; steroid were working and now the pain is getting worse and pain is radiating down left leg and she is also having frequency of urine with this. Has the patient had close contact with a person known or suspected to have the novel coronavirus illness OR traveled / lives in area with major community spread (including international travel) in the last 14 days from the onset of symptoms? * If Asymptomatic, screen for exposure and travel within the last 14 days. ---No Does the patient have any new or worsening symptoms? ---Yes Will a triage be completed? ---Yes Related visit to physician within the last 2 weeks? ---Yes Does the PT have any chronic conditions? (i.e. diabetes, asthma, this includes High risk factors for pregnancy, etc.) ---No Is the patient pregnant or possibly pregnant? (Ask all females between the ages of 21-55) ---No Is this a behavioral health or substance abuse call? ---No Guidelines Guideline Title Affirmed Question Affirmed Notes Nurse Date/Time (Eastern Time) Back Pain [1] SEVERE back pain (e.g., excruciating, unable to do any normal Ivin Booty 06/15/2020 7:39:48 AMPLEASE NOTE: All timestamps contained within this report are represented as [place] Standard Time. CONFIDENTIALTY NOTICE: This fax transmission is intended only for the addressee. It contains information that is legally privileged, confidential or otherwise protected from use or disclosure. If you are not the intended recipient, you are strictly prohibited from reviewing, disclosing, copying using or disseminating any of this information or taking any action in reliance on or regarding this information. If you have received this fax in error, please  notify us immediately by telephone so that we can arrange for its return to [place]. Phone: [phone], Toll-Free: [phone], Fax: [phone] Page: 2 of 2 Call Id: [id number] Guidelines Guideline Title Affirmed Question Affirmed Notes Nurse Date/Time Eilene Ghazi Time) activities) AND [2] not improved 2 hours after pain medicine Disp. Time Eilene Ghazi Time) Disposition Final User 06/15/2020 7:36:23 AM Send to Urgent Queue Baruch Goldmann 06/15/2020 7:43:26 AM See HCP within 4 Hours (or PCP triage) Yes Wynetta Emery, RN, Christin Bach Disagree/Comply Comply Caller Understands Yes PreDisposition Call Doctor Care Advice Given Per Guideline SEE HCP WITHIN 4 HOURS (OR PCP TRIAGE): * ACETAMINOPHEN - REGULAR STRENGTH TYLENOL: Take 650 mg (two 325 mg pills) by mouth every 4 to 6 hours as needed. Each Regular Strength Tylenol pill has 325 mg of acetaminophen. The most you should take each day is 3,250 mg (10 pills a day). * ACETAMINOPHEN - EXTRA STRENGTH TYLENOL: Take 1,000 mg (two 500 mg pills) every 8 hours as needed. Each Extra Strength Tylenol pill has 500 mg of acetaminophen. The most you should take each day is 3,000 mg (6 pills a day). * IBUPROFEN (E.G., MOTRIN, ADVIL): Take 400 mg (two 200 mg pills) by mouth every 6 hours. The most you should take each day is 1,200 mg (six 200 mg pills), unless your doctor has told you to take more. * NAPROXEN (E.G., ALEVE): Take 220 mg (one 220 mg pill) by mouth every 8 to 12 hours as needed. You may take 440 mg (two 220 mg pills) for your first dose. The most you should take each day is 660 mg (three 220 mg pills a day), unless your doctor has told you to take more. * You become worse. CARE ADVICE  given per Back Pain (Adult) guideline. Comments User: Michele Rockers, RN Date/Time Eilene Ghazi Time): 06/15/2020 7:45:22 AM RN NOTE needs appt today 4 hour outcome back pain radiating into left leg can't get any comfort Referrals REFERRED TO PCP OFFIC

## 2020-06-15 NOTE — Telephone Encounter (Signed)
FYI

## 2020-06-15 NOTE — Patient Instructions (Signed)
1) referral to physical therapy. They will call and set this up 2) referral to sports med to be seen on Monday or Tuesday next week with dr. Georgina Snell (they will call you) 3) try these exercises to help relieve pain 4) I sent in gabapentin which is a nerve medication to see if can help with the nerve pain. Can take up to 3x/day, but may make you drowsy.  5) toradol shot today. Can continue NSAIDs prn with food that dr. Jonni Sanger gave you.  6) hesitant to give more steroids with blood pressure 7) pain medication for severe pain: tramadol.   Sciatica Rehab Ask your health care provider which exercises are safe for you. Do exercises exactly as told by your health care provider and adjust them as directed. It is normal to feel mild stretching, pulling, tightness, or discomfort as you do these exercises. Stop right away if you feel sudden pain or your pain gets worse. Do not begin these exercises until told by your health care provider. Stretching and range-of-motion exercises These exercises warm up your muscles and joints and improve the movement and flexibility of your hips and back. These exercises also help to relieve pain, numbness, and tingling. Sciatic nerve glide 1. Sit in a chair with your head facing down toward your chest. Place your hands behind your back. Let your shoulders slump forward. 2. Slowly straighten one of your legs while you tilt your head back as if you are looking toward the ceiling. Only straighten your leg as far as you can without making your symptoms worse. 3. Hold this position for __________ seconds. 4. Slowly return to the starting position. 5. Repeat with your other leg. Repeat __________ times. Complete this exercise __________ times a day. Knee to chest with hip adduction and internal rotation  1. Lie on your back on a firm surface with both legs straight. 2. Bend one of your knees and move it up toward your chest until you feel a gentle stretch in your lower back and  buttock. Then, move your knee toward the shoulder that is on the opposite side from your leg. This is hip adduction and internal rotation. ? Hold your leg in this position by holding on to the front of your knee. 3. Hold this position for __________ seconds. 4. Slowly return to the starting position. 5. Repeat with your other leg. Repeat __________ times. Complete this exercise __________ times a day. Prone extension on elbows  1. Lie on your abdomen on a firm surface. A bed may be too soft for this exercise. 2. Prop yourself up on your elbows. 3. Use your arms to help lift your chest up until you feel a gentle stretch in your abdomen and your lower back. ? This will place some of your body weight on your elbows. If this is uncomfortable, try stacking pillows under your chest. ? Your hips should stay down, against the surface that you are lying on. Keep your hip and back muscles relaxed. 4. Hold this position for __________ seconds. 5. Slowly relax your upper body and return to the starting position. Repeat __________ times. Complete this exercise __________ times a day. Strengthening exercises These exercises build strength and endurance in your back. Endurance is the ability to use your muscles for a long time, even after they get tired. Pelvic tilt This exercise strengthens the muscles that lie deep in the abdomen. 1. Lie on your back on a firm surface. Bend your knees and keep your feet flat  on the floor. 2. Tense your abdominal muscles. Tip your pelvis up toward the ceiling and flatten your lower back into the floor. ? To help with this exercise, you may place a small towel under your lower back and try to push your back into the towel. 3. Hold this position for __________ seconds. 4. Let your muscles relax completely before you repeat this exercise. Repeat __________ times. Complete this exercise __________ times a day. Alternating arm and leg raises  1. Get on your hands and knees  on a firm surface. If you are on a hard floor, you may want to use padding, such as an exercise mat, to cushion your knees. 2. Line up your arms and legs. Your hands should be directly below your shoulders, and your knees should be directly below your hips. 3. Lift your left leg behind you. At the same time, raise your right arm and straighten it in front of you. ? Do not lift your leg higher than your hip. ? Do not lift your arm higher than your shoulder. ? Keep your abdominal and back muscles tight. ? Keep your hips facing the ground. ? Do not arch your back. ? Keep your balance carefully, and do not hold your breath. 4. Hold this position for __________ seconds. 5. Slowly return to the starting position. 6. Repeat with your right leg and your left arm. Repeat __________ times. Complete this exercise __________ times a day. Posture and body mechanics Good posture and healthy body mechanics can help to relieve stress in your body's tissues and joints. Body mechanics refers to the movements and positions of your body while you do your daily activities. Posture is part of body mechanics. Good posture means:  Your spine is in its natural S-curve position (neutral).  Your shoulders are pulled back slightly.  Your head is not tipped forward. Follow these guidelines to improve your posture and body mechanics in your everyday activities. Standing   When standing, keep your spine neutral and your feet about hip width apart. Keep a slight bend in your knees. Your ears, shoulders, and hips should line up.  When you do a task in which you stand in one place for a long time, place one foot up on a stable object that is 2-4 inches (5-10 cm) high, such as a footstool. This helps keep your spine neutral. Sitting   When sitting, keep your spine neutral and keep your feet flat on the floor. Use a footrest, if necessary, and keep your thighs parallel to the floor. Avoid rounding your shoulders, and  avoid tilting your head forward.  When working at a desk or a computer, keep your desk at a height where your hands are slightly lower than your elbows. Slide your chair under your desk so you are close enough to maintain good posture.  When working at a computer, place your monitor at a height where you are looking straight ahead and you do not have to tilt your head forward or downward to look at the screen. Resting  When lying down and resting, avoid positions that are most painful for you.  If you have pain with activities such as sitting, bending, stooping, or squatting, lie in a position in which your body does not bend very much. For example, avoid curling up on your side with your arms and knees near your chest (fetal position).  If you have pain with activities such as standing for a long time or reaching with your arms,  lie with your spine in a neutral position and bend your knees slightly. Try the following positions: ? Lying on your side with a pillow between your knees. ? Lying on your back with a pillow under your knees. Lifting   When lifting objects, keep your feet at least shoulder width apart and tighten your abdominal muscles.  Bend your knees and hips and keep your spine neutral. It is important to lift using the strength of your legs, not your back. Do not lock your knees straight out.  Always ask for help to lift heavy or awkward objects. This information is not intended to replace advice given to you by your health care provider. Make sure you discuss any questions you have with your health care provider. Document Revised: 02/12/2019 Document Reviewed: 11/12/2018 Elsevier Patient Education  [place].

## 2020-06-15 NOTE — Telephone Encounter (Signed)
Please call her: if pain is same in quality but stronger, I will call in pain medication. Try to see her next week. IF too severe or weakness or urinary incontinence, needs to go to ER.

## 2020-06-15 NOTE — Progress Notes (Signed)
Patient: Tiffany Beltran MRN: [id number] DOB: 1966/03/03 PCP: Leamon Arnt, MD     Subjective:  Chief Complaint  Patient presents with  . Back Pain  . Urinary Frequency    HPI: The patient is a 54 y.o. female who presents today for severe back pain. She saw Dr. Jonni Sanger on last Thursday, and was given Prednisone, NSAIDs and had xrays done. She states for 3-4 days she was much better and then on Monday pain started to return. She had the cramping that started and then on Wednesday it was so unbearable she couldn't sit down. She has been up all night due to the pain. She has pain lying down and can't find any way to get comfortable. She has pain in her left lower back that travels to her groin and then down into her knee. She feels the pain mainly behind her knee. She has tingling and burning down the leg and has some numbness type feeling down the leg as well. She can walk fine and denies any weakness. Denies any foot drop and denies any urinary incontinence. Denies any saddle parasthesia. Denies any fever/chills. She said toradol helped when she went to urgent care (3 weeks ago).   Pain rated as a 9/10 and is sharp in nature, especially if she moves the left leg. Heating pad and cold compresses do not help either. Some stretches did help some.   Right hip xray: normal Lumbar spine: mild to moderate degenerative changes most prominent in the lower lumbar spine. No fracture/subluxation.   Review of Systems  Gastrointestinal: Negative for abdominal pain, nausea and vomiting.  Genitourinary: Positive for frequency. Negative for hematuria, urgency and vaginal bleeding.  Musculoskeletal: Positive for back pain.  Neurological: Negative for dizziness and headaches.    Allergies Patient has No Known Allergies.  Past Medical History Patient  has a past medical history of Allergy, Anemia, Arthritis, Asthma, Headache(784.0), Migraines, and Undiagnosed cardiac murmurs.  Surgical History Patient  has no  past surgical history on file.  Family History Pateint's family history includes Breast cancer (age of onset: 32) in her sister; Glaucoma in her mother; Hypertension in an other family member.  Social History Patient  reports that she has never smoked. She has never used smokeless tobacco. She reports that she does not drink alcohol and does not use drugs.    Objective: Vitals:   06/15/20 1134  BP: (!) 156/86  Pulse: 91  Temp: 97.7 F (36.5 C)  TempSrc: Temporal  SpO2: 99%  Weight: 183 lb 4.8 oz (83.1 kg)  Height: 5\' 4"  (1.626 m)    Body mass index is 31.46 kg/m.  Physical Exam Vitals reviewed.  Constitutional:      General: She is in acute distress.     Appearance: Normal appearance.  HENT:     Head: Normocephalic and atraumatic.  Pulmonary:     Effort: Pulmonary effort is normal.  Musculoskeletal:        General: Tenderness (left SI joint) present.     Comments: Negative straight leg tests, but has pain in her left lower back with right leg raise. strength intact in lower extremities. Gait normal   Neurological:     General: No focal deficit present.     Mental Status: She is alert and oriented to person, place, and time.    Reviewed xrays     Assessment/plan: 1. Acute left-sided low back pain with left-sided sciatica toradol shot today. Just completed high dose steroid burst. Trial of gabapentin  and tramadol for severe pain and continued nsaids prn. Referral to PT placed as well. Handout given with exercises to start. Will also see sports med next week. May need MRI if no improvement.  Er precautions given for saddle paraesthesias, foot drop/leg weakness, urinary incontinence.   - ketorolac (TORADOL) injection 60 mg - Ambulatory referral to Sports Medicine - Ambulatory referral to Physical Therapy  2. Sacroiliitis ([place]) May do well from injection. Hand out of exercises given as well to start this weekend and will f/u with dr. Georgina Snell on Monday or Tuesday of next  week.  - Ambulatory referral to Sports Medicine - Ambulatory referral to Physical Therapy  3. Urinary frequency -no flank pain or other urinary symptoms -checking Ua/culture and will follow up.   This visit occurred during the SARS-CoV-2 public health emergency.  Safety protocols were in place, including screening questions prior to the visit, additional usage of staff PPE, and extensive cleaning of exam room while observing appropriate contact time as indicated for disinfecting solutions.     Return if symptoms worsen or fail to improve.   Orma Flaming, MD [place]   06/15/2020

## 2020-06-21 ENCOUNTER — Encounter: Payer: Self-pay | Admitting: Family Medicine

## 2020-06-21 ENCOUNTER — Ambulatory Visit (INDEPENDENT_AMBULATORY_CARE_PROVIDER_SITE_OTHER): Payer: BC Managed Care – PPO | Admitting: Family Medicine

## 2020-06-21 ENCOUNTER — Other Ambulatory Visit: Payer: Self-pay

## 2020-06-21 VITALS — BP 160/90 | HR 99 | Ht 64.0 in | Wt 182.8 lb

## 2020-06-21 DIAGNOSIS — M5442 Lumbago with sciatica, left side: Secondary | ICD-10-CM

## 2020-06-21 NOTE — Patient Instructions (Signed)
Thank you for coming in today. Plan for PT.  Try the gabapentin mostly as needed at bedtime.  If not improving or if worsening next step is MRI.  Let me know.  Or recheck in 4-6 weeks.   Keep me updated.

## 2020-06-21 NOTE — Progress Notes (Signed)
Subjective:    I'm seeing this patient as a consultation for:  Dr. Rogers Blocker and Dr. Jonni Sanger. Note will be routed back to referring provider/PCP.  CC: Low back pain and L LE pain  I, Molly Weber, LAT, ATC, am serving as scribe for Dr. Lynne Leader.  HPI: Pt is a 54 y/o female presenting w/ c/o back pain x approximately 2-3 weeks w/ radiating pain to the L anterior lower leg.  She has been seen 2x by primary care and has been prescribed prednisone, gabapentin and tramadol.  The only medication that seemed to help was the prednisone.  She never did take the gabapentin and only took the tramadol very infrequently.  Radiating pain: yes to the L ant. shin L LE numbness/tingling: yes L LE weakness: yes Aggravating factors: attempts at moving L leg; prolonged sitting; prolonged standing; laying supine Treatments tried: prednisone, gabapentin, Tramadol; heat; ice; Tylenol  Diagnostic testing: L hip and L-spine XR- 06/08/20  Past medical history, Surgical history, Family history, Social history, Allergies, and medications have been entered into the medical record, reviewed.   Review of Systems: No new headache, visual changes, nausea, vomiting, diarrhea, constipation, dizziness, abdominal pain, skin rash, fevers, chills, night sweats, weight loss, swollen lymph nodes, body aches, joint swelling, muscle aches, chest pain, shortness of breath, mood changes, visual or auditory hallucinations.   Objective:    Vitals:   06/21/20 1500  BP: (!) 160/90  Pulse: 99  SpO2: 98%   General: Well Developed, well nourished, and in no acute distress.  Neuro/Psych: Alert and oriented x3, extra-ocular muscles intact, able to move all 4 extremities, sensation grossly intact. Skin: Warm and dry, no rashes noted.  Respiratory: Not using accessory muscles, speaking in full sentences, trachea midline.  Cardiovascular: Pulses palpable, no extremity edema. Abdomen: Does not appear distended. MSK: L-spine nontender  midline. Normal lumbar motion. Lower extremity strength reflexes and sensation intact distally. Positive left-sided slump test.  Lab and Radiology Results EXAM: DG HIP (WITH OR WITHOUT PELVIS) 2-3V LEFT  COMPARISON:  None.  FINDINGS: No acute fracture or dislocation. Joint spaces and alignment are maintained. No area of erosion or osseous destruction. No unexpected radiopaque foreign body. Pelvic phleboliths.  IMPRESSION: Negative.   Electronically Signed   By: Valentino Saxon MD   On: 06/08/2020 10:13 EXAM: LUMBAR SPINE - COMPLETE 4+ VIEW  COMPARISON:  December 04, 2010  FINDINGS: There are five non-rib bearing lumbar-type vertebral bodies. There is normal alignment. There is no evidence for acute fracture or subluxation. There is moderate intervertebral disc space height loss with endplate proliferative changes at L5-S1. There is mild intervertebral disc space height loss at L4-5. Lower lumbar facet arthropathy. Pelvic phleboliths.  IMPRESSION: Mild-to-moderate degenerative changes most predominant in the lower lumbar spine. No acute fracture or subluxation.   Electronically Signed   By: Valentino Saxon MD   On: 06/08/2020 10:12  I, Lynne Leader, personally (independently) visualized and performed the interpretation of the images attached in this note.   Impression and Recommendations:    Assessment and Plan: 54 y.o. female with lumbar radiculopathy left leg.  Likely L4 dermatomal pattern.  Plan for trial of physical therapy and gabapentin.  If not sufficient patient will notify me we will proceed with MRI for potential epidural steroid injection planning.. Recheck in 4 weeks if all is well otherwise patient will notify me we will proceed with MRI.   Orders Placed This Encounter  Procedures  . Ambulatory referral to Physical Therapy  Referral Priority:   Routine    Referral Type:   Physical Medicine    Referral Reason:   Specialty Services  Required    Requested Specialty:   Physical Therapy   No orders of the defined types were placed in this encounter.   Discussed warning signs or symptoms. Please see discharge instructions. Patient expresses understanding.   The above documentation has been reviewed and is accurate and complete Lynne Leader, M.D.

## 2020-07-01 ENCOUNTER — Other Ambulatory Visit: Payer: Self-pay | Admitting: Family Medicine

## 2020-07-06 ENCOUNTER — Encounter: Payer: Self-pay | Admitting: Physical Therapy

## 2020-07-06 ENCOUNTER — Ambulatory Visit (INDEPENDENT_AMBULATORY_CARE_PROVIDER_SITE_OTHER): Payer: BC Managed Care – PPO | Admitting: Physical Therapy

## 2020-07-06 ENCOUNTER — Other Ambulatory Visit: Payer: Self-pay

## 2020-07-06 ENCOUNTER — Ambulatory Visit: Payer: BC Managed Care – PPO | Admitting: Physical Therapy

## 2020-07-06 DIAGNOSIS — M5442 Lumbago with sciatica, left side: Secondary | ICD-10-CM

## 2020-07-06 NOTE — Patient Instructions (Signed)
Access Code: [email or web address] URL: [email or web address] Date: 07/06/2020 Prepared by: Lyndee Hensen  Exercises Prone Press Up on Elbows - 2 x daily - 3 min hold Prone Press Up on Elbows - 5 x daily - 2 sets - 10 reps Prone Press Up - 5 x daily - 2 sets - 10 reps Cat Cow - 2 x daily - 2 sets - 10 reps Standing Lumbar Extension - 4 x daily - 2 sets - 10 reps Supine Transversus Abdominis Bracing - Hands on Ground - 1 x daily - 2 sets - 10 reps

## 2020-07-06 NOTE — Therapy (Signed)
[facility] [address] [place], [place], [address] Phone: [phone]   Fax:  [phone]  Physical Therapy Evaluation  Patient Details  Name: Tiffany Beltran MRN: [id number] Date of Birth: [birth year] Referring Provider (PT): Lynne Leader   Encounter Date: 07/06/2020   PT End of Session - 07/06/20 1228    Visit Number 1    Number of Visits 12    Date for PT Re-Evaluation 08/17/20    Authorization Type BCBS    PT Start Time 1217    PT Stop Time 1256    PT Time Calculation (min) 39 min    Activity Tolerance Patient tolerated treatment well    Behavior During Therapy [facility] for tasks assessed/performed           Past Medical History:  Diagnosis Date  . Allergy   . Anemia   . Arthritis    right thumb only  . Asthma   . Headache(784.0)   . Migraines    last one in  2017  . Undiagnosed cardiac murmurs    MR and AR on echocardiogram - never caused any problems    History reviewed. No pertinent surgical history.  There were no vitals filed for this visit.    Subjective Assessment - 07/06/20 1221    Subjective Iniital pain started in July.  L LE pain  . Was doing a lot of extra activity because her sister passed away, was cleaning out, etc. does have back pain now, and in L LE intermittently, posterior thigh. Was past knee, but is not now.  Pain significantly better now than it was initially. Likes to spin, zumba, walking. Works from home, sits at [employer].    Limitations Reading;House hold activities;Walking;Standing;Lifting    Patient Stated Goals Decreased pain    Currently in Pain? Yes    Pain Score 3     Pain Location Back    Pain Orientation Left    Pain Descriptors / Indicators Aching    Pain Type Chronic pain    Pain Radiating Towards L LE/ thigh    Pain Onset More than a month ago    Pain Frequency Intermittent    Aggravating Factors  sitting for a long time,    Pain Relieving Factors heat, ice              [facility]  PT Assessment - 07/06/20 0001      Assessment   Medical Diagnosis Back Pain     Referring Provider (PT) Lynne Leader    Prior Therapy no      Balance Screen   Has the patient fallen in the past 6 months No      Prior Function   Level of Independence Independent      Cognition   Overall Cognitive Status Within Functional Limits for tasks assessed      Posture/Postural Control   Posture Comments Mild lateral shift, to R, increased weight bearing on R LE vs L.       ROM / Strength   AROM / PROM / Strength AROM;Strength      AROM   Overall AROM Comments hip ROM: WNL    AROM Assessment Site Lumbar    Lumbar Flexion mild limitation/ mild fear    Lumbar Extension mild limitation    Lumbar - Right Side Bend mild limitation    Lumbar - Left Side Bend mild limitation      Strength   Overall Strength Comments L hip: 4-/5, R:  4/5 to 4+/5 ;  L knee: 4+/5       Palpation   Palpation comment Minimal pain in lumbar musculature. Tenderness in central lumbar spine with PAs. Tenderness into L Glute       Special Tests   Other special tests + SLR on L                       Objective measurements completed on examination: See above findings.       [facility] Adult PT Treatment/Exercise - 07/06/20 0001      Exercises   Exercises Lumbar      Lumbar Exercises: Stretches   Prone on Elbows Stretch 60 seconds;3 reps    Press Ups 20 reps    Press Ups Limitations x20 to elbows and to hands     Piriformis Stretch 2 reps;30 seconds    Piriformis Stretch Limitations supine       Lumbar Exercises: Standing   Other Standing Lumbar Exercises EIS x 15;       Lumbar Exercises: Quadruped   Madcat/Old Horse 20 reps                  PT Education - 07/06/20 1359    Education Details PT POC, Exam findings, HEP.Reviewed activities to limit at this time, and extension bias program.    Person(s) Educated Patient    Methods Explanation;Demonstration;Tactile cues;Verbal  cues;Handout    Comprehension Verbalized understanding;Returned demonstration;Verbal cues required;Tactile cues required;Need further instruction            PT Short Term Goals - 07/06/20 1404      PT SHORT TERM GOAL #1   Title Pt to be independent with initial HEP    Time 2    Period Weeks    Status New    Target Date 07/20/20      PT SHORT TERM GOAL #2   Title Pt to report decreased pain in L LE to 0-2/10 with sitting and activity    Time 2    Period Weeks    Status New    Target Date 07/20/20             PT Long Term Goals - 07/06/20 1405      PT LONG TERM GOAL #1   Title Pt to be independent with final HEP    Time 6    Period Weeks    Status New    Target Date 08/17/20      PT LONG TERM GOAL #2   Title Pt to report decreased pain in back and L LE to 0-1/10 with standing activity and sitting for at least 30 min    Time 6    Period Weeks    Status New    Target Date 08/17/20      PT LONG TERM GOAL #3   Title Pt to demo improved strength of hips and LEs to at least 4+/5 to improve stability and gait    Time 6    Period Weeks    Status New    Target Date 08/17/20      PT LONG TERM GOAL #4   Title Pt to demo proper mechanics for bend/lift/squat to improve ability and safety with functional activitiy and IADLs.    Time 6    Period Weeks    Status New    Target Date 08/17/20  Plan - 07/06/20 1407    Clinical Impression Statement Pt presents with symptoms consistent with disc pathology. She has decreased ROM for lumbar spine, and decreased strength of hips and core. Pt with mild lateral shift, and feels L LE weaker than R. She has + nerve tension in L LE that has improved some since initial injury. Pt with decreased ability for full fucntional activities, work duties, sitting, and IADLs. Pt to benefit from skilled PT to improve deficits and pain.    Examination-Activity Limitations Locomotion Level;Bend;Sit;Sleep;Stairs;Stand;Lift     Examination-Participation Restrictions Meal Prep;Church;Cleaning;Occupation;Community Activity;Driving;Laundry;Yard Work;Shop    Stability/Clinical Decision Making Stable/Uncomplicated    Clinical Decision Making Low    Rehab Potential Good    PT Frequency 2x / week    PT Duration 6 weeks    PT Treatment/Interventions ADLs/Self Care Home Management;Cryotherapy;Electrical Stimulation;Gait training;DME Instruction;Ultrasound;Traction;Moist Heat;Iontophoresis 4mg /ml Dexamethasone;Stair training;Functional mobility training;Therapeutic activities;Therapeutic exercise;Balance training;Neuromuscular re-education;Manual techniques;Orthotic Fit/Training;Patient/family education;Passive range of motion;Dry needling;Taping;Joint Manipulations;Spinal Manipulations;Vasopneumatic Device    Consulted and Agree with Plan of Care Patient           Patient will benefit from skilled therapeutic intervention in order to improve the following deficits and impairments:  Abnormal gait, Decreased range of motion, Increased muscle spasms, Decreased activity tolerance, Pain, Hypomobility, Decreased balance, Impaired flexibility, Improper body mechanics, Decreased strength, Decreased mobility  Visit Diagnosis: Acute left-sided low back pain with left-sided sciatica     Problem List Patient Active Problem List   Diagnosis Date Noted  . Degenerative joint disease (DJD) of lumbar spine 06/08/2020  . Exercise-induced asthma 02/18/2017  . Migraine without aura 06/19/2007    Lyndee Hensen, PT, DPT 2:15 PM  07/06/20    [facility] [place], [place], [address] Phone: [phone]   Fax:  [phone]  Name: Angeleen Horney MRN: [id number] Date of Birth: 1965-12-18

## 2020-07-12 ENCOUNTER — Encounter: Payer: Self-pay | Admitting: Physical Therapy

## 2020-07-12 ENCOUNTER — Ambulatory Visit: Payer: BC Managed Care – PPO | Admitting: Family Medicine

## 2020-07-12 ENCOUNTER — Ambulatory Visit (INDEPENDENT_AMBULATORY_CARE_PROVIDER_SITE_OTHER): Payer: BC Managed Care – PPO | Admitting: Physical Therapy

## 2020-07-12 ENCOUNTER — Other Ambulatory Visit: Payer: Self-pay

## 2020-07-12 DIAGNOSIS — M5442 Lumbago with sciatica, left side: Secondary | ICD-10-CM

## 2020-07-12 NOTE — Therapy (Signed)
[facility] [address] [place], [place], [address] Phone: [phone]   Fax:  [phone]  Physical Therapy Treatment  Patient Details  Name: Tiffany Beltran MRN: [id number] Date of Birth: 21-Jun-1966 Referring Provider (PT): Lynne Leader   Encounter Date: 07/12/2020   PT End of Session - 07/12/20 0807    Visit Number 2    Number of Visits 12    Date for PT Re-Evaluation 08/17/20    Authorization Type BCBS    PT Start Time 0803    PT Stop Time 0845    PT Time Calculation (min) 42 min    Activity Tolerance Patient tolerated treatment well    Behavior During Therapy [facility] for tasks assessed/performed           Past Medical History:  Diagnosis Date  . Allergy   . Anemia   . Arthritis    right thumb only  . Asthma   . Headache(784.0)   . Migraines    last one in  2017  . Undiagnosed cardiac murmurs    MR and AR on echocardiogram - never caused any problems    History reviewed. No pertinent surgical history.  There were no vitals filed for this visit.   Subjective Assessment - 07/12/20 0806    Subjective Pt states improving pain. Has not had to take pain meds in 3 days. Has been doing HEP.    Currently in Pain? Yes    Pain Score 1     Pain Location Back    Pain Orientation Left    Pain Descriptors / Indicators Aching    Pain Type Acute pain    Pain Onset More than a month ago    Pain Frequency Intermittent                             [facility] Adult PT Treatment/Exercise - 07/12/20 0001      Posture/Postural Control   Posture Comments Mild lateral shift, to R, increased weight bearing on R LE vs L.       Exercises   Exercises Lumbar      Lumbar Exercises: Stretches   Pelvic Tilt 15 reps    Prone on Elbows Stretch 60 seconds    Press Ups 20 reps    Press Ups Limitations x20 to hands     Piriformis Stretch 2 reps;30 seconds    Piriformis Stretch Limitations supine       Lumbar Exercises: Standing    Other Standing Lumbar Exercises EIS x 15;  Side glides to the L  x 15 at wall ;       Lumbar Exercises: Supine   Ab Set 10 reps    Bridge 20 reps      Lumbar Exercises: Sidelying   Hip Abduction 20 reps;Both      Lumbar Exercises: Quadruped   Madcat/Old Horse 20 reps      Manual Therapy   Manual Therapy Joint mobilization;Manual Traction    Joint Mobilization PA mobs lumbar spine gr 3     Manual Traction L LE long leg distraction for lumbar pump x 3 min                     PT Short Term Goals - 07/06/20 1404      PT SHORT TERM GOAL #1   Title Pt to be independent with initial HEP    Time 2  Period Weeks    Status New    Target Date 07/20/20      PT SHORT TERM GOAL #2   Title Pt to report decreased pain in L LE to 0-2/10 with sitting and activity    Time 2    Period Weeks    Status New    Target Date 07/20/20             PT Long Term Goals - 07/06/20 1405      PT LONG TERM GOAL #1   Title Pt to be independent with final HEP    Time 6    Period Weeks    Status New    Target Date 08/17/20      PT LONG TERM GOAL #2   Title Pt to report decreased pain in back and L LE to 0-1/10 with standing activity and sitting for at least 30 min    Time 6    Period Weeks    Status New    Target Date 08/17/20      PT LONG TERM GOAL #3   Title Pt to demo improved strength of hips and LEs to at least 4+/5 to improve stability and gait    Time 6    Period Weeks    Status New    Target Date 08/17/20      PT LONG TERM GOAL #4   Title Pt to demo proper mechanics for bend/lift/squat to improve ability and safety with functional activitiy and IADLs.    Time 6    Period Weeks    Status New    Target Date 08/17/20                 Plan - 07/12/20 0848    Clinical Impression Statement Pt with improving pain in back and LE. Still tender with PA mobs and palpation of central lumbar spine today. Ther ex progressed for pain , mobility, and strengthening.     Examination-Activity Limitations Locomotion Level;Bend;Sit;Sleep;Stairs;Stand;Lift    Examination-Participation Restrictions Meal Prep;Church;Cleaning;Occupation;Community Activity;Driving;Laundry;Yard Work;Shop    Stability/Clinical Decision Making Stable/Uncomplicated    Rehab Potential Good    PT Frequency 2x / week    PT Duration 6 weeks    PT Treatment/Interventions ADLs/Self Care Home Management;Cryotherapy;Electrical Stimulation;Gait training;DME Instruction;Ultrasound;Traction;Moist Heat;Iontophoresis 4mg /ml Dexamethasone;Stair training;Functional mobility training;Therapeutic activities;Therapeutic exercise;Balance training;Neuromuscular re-education;Manual techniques;Orthotic Fit/Training;Patient/family education;Passive range of motion;Dry needling;Taping;Joint Manipulations;Spinal Manipulations;Vasopneumatic Device    Consulted and Agree with Plan of Care Patient           Patient will benefit from skilled therapeutic intervention in order to improve the following deficits and impairments:  Abnormal gait, Decreased range of motion, Increased muscle spasms, Decreased activity tolerance, Pain, Hypomobility, Decreased balance, Impaired flexibility, Improper body mechanics, Decreased strength, Decreased mobility  Visit Diagnosis: Acute left-sided low back pain with left-sided sciatica     Problem List Patient Active Problem List   Diagnosis Date Noted  . Degenerative joint disease (DJD) of lumbar spine 06/08/2020  . Exercise-induced asthma 02/18/2017  . Migraine without aura 06/19/2007   Lyndee Hensen, PT, DPT 8:49 AM  07/12/20    [facility] [place], [place], [address] Phone: [phone]   Fax:  [phone]  Name: Tiffany Beltran MRN: [id number] Date of Birth: 01/19/1966

## 2020-07-17 ENCOUNTER — Encounter: Payer: BC Managed Care – PPO | Admitting: Physical Therapy

## 2020-07-18 ENCOUNTER — Ambulatory Visit (INDEPENDENT_AMBULATORY_CARE_PROVIDER_SITE_OTHER): Payer: BC Managed Care – PPO | Admitting: Physical Therapy

## 2020-07-18 ENCOUNTER — Other Ambulatory Visit: Payer: Self-pay

## 2020-07-18 ENCOUNTER — Encounter: Payer: Self-pay | Admitting: Physical Therapy

## 2020-07-18 DIAGNOSIS — M5442 Lumbago with sciatica, left side: Secondary | ICD-10-CM

## 2020-07-18 NOTE — Therapy (Addendum)
[facility] [address] [place], [place], [address] Phone: [phone]   Fax:  [phone]  Physical Therapy Treatment  Patient Details  Name: Tiffany Beltran MRN: [id number] Date of Birth: Sep 17, 1966 Referring Provider (PT): Lynne Leader   Encounter Date: 07/18/2020   PT End of Session - 07/18/20 0810    Visit Number 3    Number of Visits 12    Date for PT Re-Evaluation 08/17/20    Authorization Type BCBS    PT Start Time 0803    PT Stop Time 0843    PT Time Calculation (min) 40 min    Activity Tolerance Patient tolerated treatment well    Behavior During Therapy [facility] for tasks assessed/performed           Past Medical History:  Diagnosis Date  . Allergy   . Anemia   . Arthritis    right thumb only  . Asthma   . Headache(784.0)   . Migraines    last one in  2017  . Undiagnosed cardiac murmurs    MR and AR on echocardiogram - never caused any problems    History reviewed. No pertinent surgical history.  There were no vitals filed for this visit.   Subjective Assessment - 07/18/20 0809    Subjective Pt states overall improving pain. No pain in LE, slight pain in back. Was sore after last session ( leg pain )    Currently in Pain? Yes    Pain Score 1     Pain Location Back    Pain Orientation Left    Pain Descriptors / Indicators Aching    Pain Type Acute pain    Pain Onset More than a month ago    Pain Frequency Intermittent                             [facility] Adult PT Treatment/Exercise - 07/18/20 0806      Posture/Postural Control   Posture Comments Mild lateral shift, to R, increased weight bearing on R LE vs L.       Exercises   Exercises Lumbar      Lumbar Exercises: Stretches   Pelvic Tilt --    Prone on Elbows Stretch --    Press Ups 20 reps    Press Ups Limitations x20 to hands     Piriformis Stretch 2 reps;30 seconds    Piriformis Stretch Limitations seated      Lumbar Exercises:  Aerobic   Recumbent Bike L2 x 7 min;       Lumbar Exercises: Standing   Row 20 reps    Theraband Level (Row) Level 3 (Green)    Other Standing Lumbar Exercises EIS x 15;      Other Standing Lumbar Exercises HIp Abd and Ext x 20 ea bil;       Lumbar Exercises: Supine   Ab Set --    Clam 20 reps    Clam Limitations GTB    Bridge --    Other Supine Lumbar Exercises mod crunch x 20;       Lumbar Exercises: Sidelying   Hip Abduction 20 reps;Both      Lumbar Exercises: Quadruped   Madcat/Old Horse 20 reps    Plank 15 sec x 3  : elbows       Manual Therapy   Manual Therapy Joint mobilization;Manual Traction    Joint Mobilization --    Manual  Traction --                  PT Education - 07/18/20 0844    Education Details HEP updated    Person(s) Educated Patient    Methods Explanation;Demonstration;Verbal cues;Handout;Tactile cues    Comprehension Verbalized understanding;Returned demonstration;Tactile cues required;Need further instruction;Verbal cues required            PT Short Term Goals - 07/06/20 1404      PT SHORT TERM GOAL #1   Title Pt to be independent with initial HEP    Time 2    Period Weeks    Status New    Target Date 07/20/20      PT SHORT TERM GOAL #2   Title Pt to report decreased pain in L LE to 0-2/10 with sitting and activity    Time 2    Period Weeks    Status New    Target Date 07/20/20             PT Long Term Goals - 07/06/20 1405      PT LONG TERM GOAL #1   Title Pt to be independent with final HEP    Time 6    Period Weeks    Status New    Target Date 08/17/20      PT LONG TERM GOAL #2   Title Pt to report decreased pain in back and L LE to 0-1/10 with standing activity and sitting for at least 30 min    Time 6    Period Weeks    Status New    Target Date 08/17/20      PT LONG TERM GOAL #3   Title Pt to demo improved strength of hips and LEs to at least 4+/5 to improve stability and gait    Time 6    Period  Weeks    Status New    Target Date 08/17/20      PT LONG TERM GOAL #4   Title Pt to demo proper mechanics for bend/lift/squat to improve ability and safety with functional activitiy and IADLs.    Time 6    Period Weeks    Status New    Target Date 08/17/20                 Plan - 07/18/20 0845    Clinical Impression Statement Pt progressing well with pain and ROM. Improved flexion ability and minimal neural tension. Pt able to progress strengthening today. Reviewed HEP, and importance of continuing prone press ups. Minimal soreness in glute and lumbar region with palpation today. L LE appears to be longer, making slight asymmetry in back, vs having lateral shift due to pain.    Examination-Activity Limitations Locomotion Level;Bend;Sit;Sleep;Stairs;Stand;Lift    Examination-Participation Restrictions Meal Prep;Church;Cleaning;Occupation;Community Activity;Driving;Laundry;Yard Work;Shop    Stability/Clinical Decision Making Stable/Uncomplicated    Rehab Potential Good    PT Frequency 2x / week    PT Duration 6 weeks    PT Treatment/Interventions ADLs/Self Care Home Management;Cryotherapy;Electrical Stimulation;Gait training;DME Instruction;Ultrasound;Traction;Moist Heat;Iontophoresis 90m/ml Dexamethasone;Stair training;Functional mobility training;Therapeutic activities;Therapeutic exercise;Balance training;Neuromuscular re-education;Manual techniques;Orthotic Fit/Training;Patient/family education;Passive range of motion;Dry needling;Taping;Joint Manipulations;Spinal Manipulations;Vasopneumatic Device    Consulted and Agree with Plan of Care Patient           Patient will benefit from skilled therapeutic intervention in order to improve the following deficits and impairments:  Abnormal gait, Decreased range of motion, Increased muscle spasms, Decreased activity tolerance, Pain, Hypomobility, Decreased balance, Impaired flexibility, Improper  body mechanics, Decreased strength,  Decreased mobility  Visit Diagnosis: Acute left-sided low back pain with left-sided sciatica     Problem List Patient Active Problem List   Diagnosis Date Noted  . Degenerative joint disease (DJD) of lumbar spine 06/08/2020  . Exercise-induced asthma 02/18/2017  . Migraine without aura 06/19/2007    Lyndee Hensen, PT, DPT 8:47 AM  07/18/20    [facility] [place], [place], [address] Phone: [phone]   Fax:  [phone]  Name: Tiffany Beltran MRN: [id number] Date of Birth: [birth year]   PHYSICAL THERAPY DISCHARGE SUMMARY  Visits from Start of Care: 3 Plan: Patient agrees to discharge.  Patient goals were partially met. Patient is being discharged due to not returning since the last visit.  ?????     Lyndee Hensen, PT, DPT 10:52 AM  02/27/21

## 2020-07-18 NOTE — Patient Instructions (Signed)
Access Code: [email or web address] URL: [email or web address] Date: 07/18/2020 Prepared by: Lyndee Hensen  Exercises Prone Press Up on Elbows - 5 x daily - 2 sets - 10 reps Prone Press Up - 5 x daily - 2 sets - 10 reps Cat Cow - 2 x daily - 2 sets - 10 reps Standing Lumbar Extension - 4 x daily - 2 sets - 10 reps Left Standing Lateral Shift Correction at Wall - Repetitions - 2 x daily - 2 sets - 10 reps Supine Bridge - 1 x daily - 2 sets - 10 reps Sidelying Hip Abduction - 1-2 x daily - 2 sets - 10 reps Hooklying Clamshell with Resistance - 1 x daily - 2 sets - 10 reps Standard Plank - 1 x daily - 3-5 reps - 30 hold

## 2020-07-25 ENCOUNTER — Encounter: Payer: BC Managed Care – PPO | Admitting: Physical Therapy

## 2020-07-26 ENCOUNTER — Encounter: Payer: BC Managed Care – PPO | Admitting: Physical Therapy

## 2020-07-27 ENCOUNTER — Encounter: Payer: BC Managed Care – PPO | Admitting: Physical Therapy

## 2020-08-02 ENCOUNTER — Encounter: Payer: BC Managed Care – PPO | Admitting: Physical Therapy

## 2020-08-02 DIAGNOSIS — Z0289 Encounter for other administrative examinations: Secondary | ICD-10-CM

## 2020-08-03 ENCOUNTER — Encounter: Payer: Self-pay | Admitting: Family Medicine

## 2020-08-03 ENCOUNTER — Ambulatory Visit (INDEPENDENT_AMBULATORY_CARE_PROVIDER_SITE_OTHER): Payer: BC Managed Care – PPO | Admitting: Family Medicine

## 2020-08-03 ENCOUNTER — Other Ambulatory Visit: Payer: Self-pay

## 2020-08-03 VITALS — BP 158/90 | HR 74 | Ht 64.0 in | Wt 184.8 lb

## 2020-08-03 DIAGNOSIS — M5442 Lumbago with sciatica, left side: Secondary | ICD-10-CM

## 2020-08-03 NOTE — Progress Notes (Signed)
   I, Wendy Poet, LAT, ATC, am serving as scribe for Dr. Lynne Leader.  Tiffany Beltran is a 54 y.o. female who presents to [place] at [facility] today for f/u of LBP and pain in her L ant lower leg.  She was last seen by Dr. Georgina Snell on 06/21/20 and was referred to PT of which she has completed 3 visits.  She was prescribed Gabapentin.  She was previously prescribed and has taken prednisone and Tramadol.  Since her last visit, pt reports that she is doing so much better and reports that she inconsistent pain at this point.  Diagnostic testing: L hip and L-spine XR- 06/08/20   Pertinent review of systems: No fevers or chills  Relevant historical information: Exercise-induced asthma.   Exam:  BP (!) 158/90 (BP Location: Right Arm, Patient Position: Sitting, Cuff Size: Normal)   Pulse 74   Ht 5\' 4"  (1.626 m)   Wt 184 lb 12.8 oz (83.8 kg)   SpO2 98%   BMI 31.72 kg/m  General: Well Developed, well nourished, and in no acute distress.   MSK: L-spine normal-appearing normal motion normal gait.      Assessment and Plan: 54 y.o. female with back pain with radiculopathy now almost completely resolved with time physical therapy and previous gabapentin and oral diclofenac.  Discussed precautions and plan to recheck back in the future if needed including trials of physical therapy in the future and ultimately MRI if needed.  However doing quite well no need for any of that at this point.  Emphasized the importance of ongoing home exercise program.     Discussed warning signs or symptoms. Please see discharge instructions. Patient expresses understanding.   The above documentation has been reviewed and is accurate and complete Lynne Leader, M.D.

## 2020-08-03 NOTE — Patient Instructions (Signed)
Thank you for coming in today.  Recheck as needed.   Can re-order physical therapy in the future if needed.

## 2020-10-19 ENCOUNTER — Other Ambulatory Visit: Payer: Self-pay

## 2020-10-19 ENCOUNTER — Encounter: Payer: Self-pay | Admitting: Family Medicine

## 2020-10-19 ENCOUNTER — Ambulatory Visit (INDEPENDENT_AMBULATORY_CARE_PROVIDER_SITE_OTHER): Payer: BC Managed Care – PPO | Admitting: Family Medicine

## 2020-10-19 VITALS — BP 142/90 | HR 80 | Temp 98.0°F | Ht 64.0 in | Wt 185.2 lb

## 2020-10-19 DIAGNOSIS — Z803 Family history of malignant neoplasm of breast: Secondary | ICD-10-CM | POA: Diagnosis not present

## 2020-10-19 DIAGNOSIS — J45990 Exercise induced bronchospasm: Secondary | ICD-10-CM | POA: Diagnosis not present

## 2020-10-19 DIAGNOSIS — M47816 Spondylosis without myelopathy or radiculopathy, lumbar region: Secondary | ICD-10-CM | POA: Diagnosis not present

## 2020-10-19 DIAGNOSIS — Z1159 Encounter for screening for other viral diseases: Secondary | ICD-10-CM

## 2020-10-19 DIAGNOSIS — Z0000 Encounter for general adult medical examination without abnormal findings: Secondary | ICD-10-CM | POA: Diagnosis not present

## 2020-10-19 DIAGNOSIS — Z9189 Other specified personal risk factors, not elsewhere classified: Secondary | ICD-10-CM | POA: Insufficient documentation

## 2020-10-19 DIAGNOSIS — Z23 Encounter for immunization: Secondary | ICD-10-CM | POA: Diagnosis not present

## 2020-10-19 DIAGNOSIS — R030 Elevated blood-pressure reading, without diagnosis of hypertension: Secondary | ICD-10-CM

## 2020-10-19 DIAGNOSIS — R311 Benign essential microscopic hematuria: Secondary | ICD-10-CM

## 2020-10-19 NOTE — Patient Instructions (Signed)
Please return in 6 months to recheck blood pressure.  Check blood pressures at home and bring in log.   Can schedule an appointment for skin tag removal at your convenience.   I will release your lab results to you on your MyChart account with further instructions. Please reply with any questions.   I have ordered a genetics referral to discuss genetics testing and have ordered a breast MRI for you. They will call you to get scheduled.   If you have any questions or concerns, please don't hesitate to send me a message via MyChart or call the office at [phone]. Thank you for visiting with [place] today! It's our pleasure caring for you.

## 2020-10-19 NOTE — Progress Notes (Signed)
Subjective  Chief Complaint  Patient presents with  . Annual Exam    Fasting, LMP Sept 2021  . Skin Tag    Multiple under left arm pit, wanting to discuss removal   . family history of breast cancer    Sister passed from breast cancer, wanting more information on sonogram vs 3D bilateral mammogram and possible genetic testing    HPI: Tiffany Beltran is a 54 y.o. female who presents to [place] at [place] today for a Female Wellness Visit. She also has the concerns and/or needs as listed above in the chief complaint. These will be addressed in addition to the Health Maintenance Visit.   Wellness Visit: annual visit with health maintenance review and exam without Pap   HM: mammo nl; feels well.  Chronic disease f/u and/or acute problem visit: (deemed necessary to be done in addition to the wellness visit):  FH of Breast cancer: sister at age 66 and then again 68; passed at age 103. Never genetic tested. No other fam hx of breast cancer. Pt is G0; her Tyrer-Cuzick breast cancer lifetime risk score is very elevated at 24%, her 10 yr risk is about 8%. She inquires if she needs genetic testing or high risk screens.   Reviewed h/o microscopic hematuria; she reports this was worked up by urology many years ago and reports a negative cystoscopy; she was told it was benign and nothing further was needed.   Back pain: had PT and is not doing well again  BP: home readings run 130s/80s. She does not get > 140/90 at home. Feels well. + HTN in family. No cp or leg swelling. Could improve diet and lose a little weight. Would like to avoid needing meds .  EIA is not active.   Assessment  1. Annual physical exam   2. At high risk for breast cancer   3. Family history of breast cancer in sister   31. Exercise-induced asthma   5. Spondylosis of lumbar region without myelopathy or radiculopathy   6. Need for hepatitis C screening test   7. Prehypertension   8. Benign essential  microscopic hematuria   9. Need for immunization against influenza      Plan  Female Wellness Visit:  Age appropriate Health Maintenance and Prevention measures were discussed with patient. Included topics are cancer screening recommendations, ways to keep healthy (see AVS) including dietary and exercise recommendations, regular eye and dental care, use of seat belts, and avoidance of moderate alcohol use and tobacco use.   BMI: discussed patient's BMI and encouraged positive lifestyle modifications to help get to or maintain a target BMI.  HM needs and immunizations were addressed and ordered. See below for orders. See HM and immunization section for updates.  Routine labs and screening tests ordered including cmp, cbc and lipids where appropriate.  Discussed recommendations regarding Vit D and calcium supplementation (see AVS)  Chronic disease management visit and/or acute problem visit:  HR for Breast ca: recommend annual breast MRI due to her elevated risk. Counseling and education given. Refer to genetics for brca 1 counseling and testing.   Prehypertension: cousneling done start low salt diet and weight loss. Continue home monitoring. Recheck in 6 months. Sooner if home readings elevated .  Chronic benign hematuria. Updated chart.   DJD back: improved. Back care emphasized.   EIA; rare albuterol use  Follow up: 13mo for recheck bp  Orders Placed This Encounter  Procedures  . MR BREAST BILATERAL  WO CONTRAST  . Flu Vaccine QUAD 36+ mos IM  . CBC with Differential/Platelet  . COMPLETE METABOLIC PANEL WITH GFR  . Hepatitis C antibody  . Lipid panel  . Urinalysis, Routine w reflex microscopic  . Ambulatory referral to Genetics   No orders of the defined types were placed in this encounter.     Lifestyle: Body mass index is 31.79 kg/m. Wt Readings from Last 3 Encounters:  10/19/20 185 lb 3.2 oz (84 kg)  08/03/20 184 lb 12.8 oz (83.8 kg)  06/21/20 182 lb 12.8 oz (82.9  kg)    Patient Active Problem List   Diagnosis Date Noted  . Prehypertension 10/19/2020  . Family history of breast cancer in sister 10/19/2020  . At high risk for breast cancer 10/19/2020  . Benign essential microscopic hematuria 10/19/2020  . Degenerative joint disease (DJD) of lumbar spine 06/08/2020    Xray 06/2020   . Exercise-induced asthma 02/18/2017  . Migraine without aura 06/19/2007   Health Maintenance  Topic Date Due  . Hepatitis C Screening  Never done  . MAMMOGRAM  01/19/2021  . COVID-19 Vaccine (4 - Booster for Moderna series) 03/20/2021  . PAP SMEAR-Modifier  08/10/2024  . TETANUS/TDAP  11/15/2024  . COLONOSCOPY  03/20/2027  . INFLUENZA VACCINE  Completed  . HIV Screening  Completed   Immunization History  Administered Date(s) Administered  . DT (Pediatric) 07/20/2003  . Influenza Inj Mdck Quad Pf 08/21/2017  . Influenza,inj,Quad PF,6+ Mos 08/28/2018, 08/11/2019, 10/19/2020  . Influenza-Unspecified 11/14/2017  . Moderna Sars-Covid-2 Vaccination 02/17/2020, 03/18/2020, 09/20/2020  . Td 11/05/2003  . Tdap 11/15/2014   We updated and reviewed the patient's past history in detail and it is documented below. Allergies: Patient has No Known Allergies. Past Medical History Patient  has a past medical history of Allergy, Anemia, Arthritis, Asthma, Headache(784.0), Migraines, and Undiagnosed cardiac murmurs. Past Surgical History Patient  has no past surgical history on file. Family History: Patient family history includes Breast cancer (age of onset: 36) in her sister; Glaucoma in her mother; Hypertension in an other family member. Social History:  Patient  reports that she has never smoked. She has never used smokeless tobacco. She reports that she does not drink alcohol and does not use drugs.  Review of Systems: Constitutional: negative for fever or malaise Ophthalmic: negative for photophobia, double vision or loss of vision Cardiovascular: negative for  chest pain, dyspnea on exertion, or new LE swelling Respiratory: negative for SOB or persistent cough Gastrointestinal: negative for abdominal pain, change in bowel habits or melena Genitourinary: negative for dysuria or gross hematuria, no abnormal uterine bleeding or disharge Musculoskeletal: negative for new gait disturbance or muscular weakness Integumentary: negative for new or persistent rashes, no breast lumps Neurological: negative for TIA or stroke symptoms Psychiatric: negative for SI or delusions Allergic/Immunologic: negative for hives  Patient Care Team    Relationship Specialty Notifications Start End  Willow Ora, MD PCP - General Family Medicine  08/11/19     Objective  Vitals: BP (!) 142/90   Pulse 80   Temp 98 F (36.7 C) (Temporal)   Ht 5\' 4"  (1.626 m)   Wt 185 lb 3.2 oz (84 kg)   SpO2 97%   BMI 31.79 kg/m  General:  Well developed, well nourished, no acute distress  Psych:  Alert and orientedx3,normal mood and affect HEENT:  Normocephalic, atraumatic, non-icteric sclera,  supple neck without adenopathy, mass or thyromegaly Cardiovascular:  Normal S1, S2, RRR without gallop,  rub or murmur Respiratory:  Good breath sounds bilaterally, CTAB with normal respiratory effort Gastrointestinal: normal bowel sounds, soft, non-tender, no noted masses. No HSM MSK: no deformities, contusions. Joints are without erythema or swelling.  Skin:  Warm, no rashes or suspicious lesions noted Neurologic:    Mental status is normal. CN 2-11 are normal. Gross motor and sensory exams are normal. Normal gait. No tremor Breast Exam: No mass, skin retraction or nipple discharge is appreciated in either breast. No axillary adenopathy. Fibrocystic changes are not noted    Commons side effects, risks, benefits, and alternatives for medications and treatment plan prescribed today were discussed, and the patient expressed understanding of the given instructions. Patient is instructed to  call or message via MyChart if he/she has any questions or concerns regarding our treatment plan. No barriers to understanding were identified. We discussed Red Flag symptoms and signs in detail. Patient expressed understanding regarding what to do in case of urgent or emergency type symptoms.   Medication list was reconciled, printed and provided to the patient in AVS. Patient instructions and summary information was reviewed with the patient as documented in the AVS. This note was prepared with assistance of Dragon voice recognition software. Occasional wrong-word or sound-a-like substitutions may have occurred due to the inherent limitations of voice recognition software  This visit occurred during the SARS-CoV-2 public health emergency.  Safety protocols were in place, including screening questions prior to the visit, additional usage of staff PPE, and extensive cleaning of exam room while observing appropriate contact time as indicated for disinfecting solutions.

## 2020-10-20 LAB — URINALYSIS, ROUTINE W REFLEX MICROSCOPIC
Bilirubin Urine: NEGATIVE
Glucose, UA: NEGATIVE
Hgb urine dipstick: NEGATIVE
Ketones, ur: NEGATIVE
Leukocytes,Ua: NEGATIVE
Nitrite: NEGATIVE
Protein, ur: NEGATIVE
Specific Gravity, Urine: 1.015 (ref 1.001–1.03)
pH: 8 (ref 5.0–8.0)

## 2020-10-20 LAB — HEPATITIS C ANTIBODY
Hepatitis C Ab: NONREACTIVE
SIGNAL TO CUT-OFF: 0.02 (ref <1.00)

## 2020-10-20 LAB — CBC WITH DIFFERENTIAL/PLATELET
Absolute Monocytes: 286 cells/uL (ref 200–950)
Basophils Absolute: 58 cells/uL (ref 0–200)
Basophils Relative: 1.1 %
Eosinophils Absolute: 429 cells/uL (ref 15–500)
Eosinophils Relative: 8.1 %
HCT: 38 % (ref 35.0–45.0)
Hemoglobin: 12.7 g/dL (ref 11.7–15.5)
Lymphs Abs: 2396 cells/uL (ref 850–3900)
MCH: 30.7 pg (ref 27.0–33.0)
MCHC: 33.4 g/dL (ref 32.0–36.0)
MCV: 91.8 fL (ref 80.0–100.0)
MPV: 9.5 fL (ref 7.5–12.5)
Monocytes Relative: 5.4 %
Neutro Abs: 2131 cells/uL (ref 1500–7800)
Neutrophils Relative %: 40.2 %
Platelets: 346 10*3/uL (ref 140–400)
RBC: 4.14 10*6/uL (ref 3.80–5.10)
RDW: 11.9 % (ref 11.0–15.0)
Total Lymphocyte: 45.2 %
WBC: 5.3 10*3/uL (ref 3.8–10.8)

## 2020-10-20 LAB — LIPID PANEL
Cholesterol: 212 mg/dL — ABNORMAL HIGH (ref <200)
HDL: 58 mg/dL (ref >=50)
LDL Cholesterol (Calc): 135 mg/dL (calc) — ABNORMAL HIGH
Non-HDL Cholesterol (Calc): 154 mg/dL (calc) — ABNORMAL HIGH (ref <130)
Total CHOL/HDL Ratio: 3.7 (calc) (ref <5.0)
Triglycerides: 90 mg/dL (ref <150)

## 2020-10-20 LAB — COMPLETE METABOLIC PANEL WITH GFR
AG Ratio: 1.5 (calc) (ref 1.0–2.5)
ALT: 8 U/L (ref 6–29)
AST: 14 U/L (ref 10–35)
Albumin: 4.2 g/dL (ref 3.6–5.1)
Alkaline phosphatase (APISO): 67 U/L (ref 37–153)
BUN: 11 mg/dL (ref 7–25)
CO2: 32 mmol/L (ref 20–32)
Calcium: 9.5 mg/dL (ref 8.6–10.4)
Chloride: 103 mmol/L (ref 98–110)
Creat: 0.83 mg/dL (ref 0.50–1.05)
GFR, Est African American: 93 mL/min/{1.73_m2} (ref >=60)
GFR, Est Non African American: 81 mL/min/{1.73_m2} (ref >=60)
Globulin: 2.8 g/dL (calc) (ref 1.9–3.7)
Glucose, Bld: 88 mg/dL (ref 65–99)
Potassium: 4.4 mmol/L (ref 3.5–5.3)
Sodium: 140 mmol/L (ref 135–146)
Total Bilirubin: 0.9 mg/dL (ref 0.2–1.2)
Total Protein: 7 g/dL (ref 6.1–8.1)

## 2020-10-30 NOTE — Addendum Note (Signed)
Addended by: Billey Chang on: 10/30/2020 12:46 PM   Modules accepted: Orders

## 2020-10-31 ENCOUNTER — Telehealth: Payer: Self-pay | Admitting: Genetic Counselor

## 2020-10-31 NOTE — Telephone Encounter (Signed)
Received a genetic counseling referral from Dr. Mardelle Matte for fhx of breast cancer. Tiffany Beltran returned my call and has been scheduled to see Clydie Braun on 1/26 at 9am. Pt aware to arrive 15 minutes early.

## 2020-11-18 ENCOUNTER — Other Ambulatory Visit: Payer: BC Managed Care – PPO

## 2020-11-27 ENCOUNTER — Ambulatory Visit (INDEPENDENT_AMBULATORY_CARE_PROVIDER_SITE_OTHER): Payer: BC Managed Care – PPO | Admitting: Family Medicine

## 2020-11-27 ENCOUNTER — Encounter: Payer: Self-pay | Admitting: Family Medicine

## 2020-11-27 ENCOUNTER — Other Ambulatory Visit: Payer: Self-pay

## 2020-11-27 VITALS — BP 130/84 | HR 102 | Temp 98.3°F | Wt 186.2 lb

## 2020-11-27 DIAGNOSIS — L918 Other hypertrophic disorders of the skin: Secondary | ICD-10-CM

## 2020-11-27 DIAGNOSIS — Z9189 Other specified personal risk factors, not elsewhere classified: Secondary | ICD-10-CM | POA: Diagnosis not present

## 2020-11-27 MED ORDER — LIDOCAINE-EPINEPHRINE 1 %-1:100000 IJ SOLN
2.0000 mL | Freq: Once | INTRAMUSCULAR | Status: AC
  Administered 2020-11-27: 2 mL

## 2020-11-27 NOTE — Progress Notes (Signed)
Subjective  CC:  Chief Complaint  Patient presents with  . skin tag removal    2 under left armpit, pain worsens during monthly menstruation     HPI: Tiffany Beltran is a 55 y.o. female who presents to the office today to address the problems listed above in the chief complaint.  Patient reports 2 skin tags that are in the center of her left armpit.  They are painful and bothersome.  No bleeding.  She would like them removed.  High risk for breast cancer: She will be scheduled with genetics and for breast MRI in March of this year.  This will be covered by insurance at that time.   Assessment  1. Skin tag   2. At high risk for breast cancer      Plan   Skin tags: Irritated.  Status post removal.  No complications.  High risk for breast cancer: Patient to schedule with genetics and for breast MRI in March.  Recommend annual MRI and mammograms.  With staggered q. 3-month intervals.  Patient stands and agrees.  She may be a candidate for genetic testing.  Follow up: As scheduled 04/20/2021  No orders of the defined types were placed in this encounter.  No orders of the defined types were placed in this encounter.     I reviewed the patients updated PMH, FH, and SocHx.    Patient Active Problem List   Diagnosis Date Noted  . Prehypertension 10/19/2020  . Family history of breast cancer in sister 10/19/2020  . At high risk for breast cancer 10/19/2020  . Benign essential microscopic hematuria 10/19/2020  . Degenerative joint disease (DJD) of lumbar spine 06/08/2020  . Exercise-induced asthma 02/18/2017  . Migraine without aura 06/19/2007   Current Meds  Medication Sig  . acetaminophen (TYLENOL) 500 MG tablet Take 1,000 mg by mouth every 6 (six) hours as needed.  Marland Kitchen albuterol (PROVENTIL HFA;VENTOLIN HFA) 108 (90 Base) MCG/ACT inhaler One puff 30 minutes prior to exercise  . aspirin 81 MG tablet Take 81 mg by mouth daily.    . Multiple Vitamin (MULTIVITAMIN) tablet Take 1  tablet by mouth daily.  Marland Kitchen OVER THE COUNTER MEDICATION Juice Plus  . rizatriptan (MAXALT) 5 MG tablet Take 1 tablet (5 mg total) by mouth as needed. May repeat in 2 hours if needed    Allergies: Patient has No Known Allergies. Family History: Patient family history includes Breast cancer (age of onset: 13) in her sister; Glaucoma in her mother; Hypertension in an other family member. Social History:  Patient  reports that she has never smoked. She has never used smokeless tobacco. She reports that she does not drink alcohol and does not use drugs.  Review of Systems: Constitutional: Negative for fever malaise or anorexia Cardiovascular: negative for chest pain Respiratory: negative for SOB or persistent cough Gastrointestinal: negative for abdominal pain  Objective  Vitals: BP 130/84   Pulse (!) 102   Temp 98.3 F (36.8 C) (Temporal)   Wt 186 lb 3.2 oz (84.5 kg)   SpO2 98%   BMI 31.96 kg/m  General: no acute distress , A&Ox3 Skin:  Warm, no rashes, left axilla with 3 skin tags centrally located.  No pigmentation, flaking  Skin Tag Removal Procedure Note Diagnosis: painful skin tags Location: Left axilla Informed Consent: Discussed risks (permanent scarring, infection, pain, bleeding, bruising, redness, and recurrence of the lesion) and benefits of the procedure, as well as the alternatives. She is aware that skin tags  are benign lesions, and their removal is often not considered medically necessary. Informed consent was obtained. Preparation: The area was prepared in a standard fashion. Anesthesia: Lidocaine 1% with epinephrine Procedure Details: Iris scissors were used to perform sharp removal. Aluminum chloride was applied for hemostasis. Ointment and bandage were applied where needed. The patient tolerated the procedure well. Total number of lesions treated: 3 Plan: The patient was instructed on post-op care. Recommend OTC analgesia as needed for pain.    Commons side  effects, risks, benefits, and alternatives for medications and treatment plan prescribed today were discussed, and the patient expressed understanding of the given instructions. Patient is instructed to call or message via MyChart if he/she has any questions or concerns regarding our treatment plan. No barriers to understanding were identified. We discussed Red Flag symptoms and signs in detail. Patient expressed understanding regarding what to do in case of urgent or emergency type symptoms.   Medication list was reconciled, printed and provided to the patient in AVS. Patient instructions and summary information was reviewed with the patient as documented in the AVS. This note was prepared with assistance of Dragon voice recognition software. Occasional wrong-word or sound-a-like substitutions may have occurred due to the inherent limitations of voice recognition software  This visit occurred during the SARS-CoV-2 public health emergency.  Safety protocols were in place, including screening questions prior to the visit, additional usage of staff PPE, and extensive cleaning of exam room while observing appropriate contact time as indicated for disinfecting solutions.

## 2020-11-29 ENCOUNTER — Other Ambulatory Visit: Payer: BC Managed Care – PPO

## 2020-11-29 ENCOUNTER — Encounter: Payer: BC Managed Care – PPO | Admitting: Genetic Counselor

## 2021-01-01 ENCOUNTER — Other Ambulatory Visit: Payer: Self-pay | Admitting: Family Medicine

## 2021-01-01 DIAGNOSIS — Z1231 Encounter for screening mammogram for malignant neoplasm of breast: Secondary | ICD-10-CM

## 2021-01-08 ENCOUNTER — Other Ambulatory Visit: Payer: Self-pay | Admitting: Family Medicine

## 2021-01-08 DIAGNOSIS — Z1231 Encounter for screening mammogram for malignant neoplasm of breast: Secondary | ICD-10-CM

## 2021-02-01 ENCOUNTER — Other Ambulatory Visit: Payer: Self-pay

## 2021-02-01 ENCOUNTER — Ambulatory Visit
Admission: RE | Admit: 2021-02-01 | Discharge: 2021-02-01 | Disposition: A | Discharge status: Home / Self Care | Payer: BC Managed Care – PPO | Source: Ambulatory Visit | Attending: Family Medicine | Admitting: Family Medicine

## 2021-02-01 DIAGNOSIS — Z1231 Encounter for screening mammogram for malignant neoplasm of breast: Secondary | ICD-10-CM

## 2021-02-21 ENCOUNTER — Ambulatory Visit: Payer: BC Managed Care – PPO

## 2021-03-06 ENCOUNTER — Other Ambulatory Visit: Payer: BC Managed Care – PPO

## 2021-03-09 ENCOUNTER — Other Ambulatory Visit: Payer: BC Managed Care – PPO

## 2021-04-13 ENCOUNTER — Ambulatory Visit
Admission: RE | Admit: 2021-04-13 | Discharge: 2021-04-13 | Disposition: A | Discharge status: Home / Self Care | Payer: BC Managed Care – PPO | Source: Ambulatory Visit | Attending: Family Medicine | Admitting: Family Medicine

## 2021-04-13 ENCOUNTER — Other Ambulatory Visit: Payer: Self-pay

## 2021-04-13 DIAGNOSIS — Z803 Family history of malignant neoplasm of breast: Secondary | ICD-10-CM

## 2021-04-13 DIAGNOSIS — C50912 Malignant neoplasm of unspecified site of left female breast: Secondary | ICD-10-CM | POA: Diagnosis not present

## 2021-04-13 DIAGNOSIS — Z9189 Other specified personal risk factors, not elsewhere classified: Secondary | ICD-10-CM

## 2021-04-13 DIAGNOSIS — C50911 Malignant neoplasm of unspecified site of right female breast: Secondary | ICD-10-CM | POA: Diagnosis not present

## 2021-04-13 MED ORDER — GADOBUTROL 1 MMOL/ML IV SOLN
10.0000 mL | Freq: Once | INTRAVENOUS | Status: AC | PRN
  Administered 2021-04-13: 10 mL via INTRAVENOUS

## 2021-04-20 ENCOUNTER — Encounter: Payer: Self-pay | Admitting: Family Medicine

## 2021-04-20 ENCOUNTER — Ambulatory Visit (INDEPENDENT_AMBULATORY_CARE_PROVIDER_SITE_OTHER): Payer: BC Managed Care – PPO | Admitting: Family Medicine

## 2021-04-20 ENCOUNTER — Other Ambulatory Visit: Payer: Self-pay

## 2021-04-20 VITALS — BP 139/72 | HR 71 | Temp 98.0°F | Ht 64.0 in | Wt 194.8 lb

## 2021-04-20 DIAGNOSIS — R030 Elevated blood-pressure reading, without diagnosis of hypertension: Secondary | ICD-10-CM | POA: Diagnosis not present

## 2021-04-20 DIAGNOSIS — I10 Essential (primary) hypertension: Secondary | ICD-10-CM | POA: Insufficient documentation

## 2021-04-20 NOTE — Progress Notes (Signed)
Subjective  CC:  Chief Complaint  Patient presents with   Hypertension    HPI: Tiffany Beltran is a 55 y.o. female who presents to the office today to address the problems listed above in the chief complaint. Blood pressure f/u: 55 year old with whitecoat hypertension and pre-hypertension.  Continues to check her blood pressures at home.  They are consistently 1 5-139 over 33s.  Remains elevated in the office today.  She does report she was rushing to get here.  She Wilder Glade not take blood pressure medication unless needed. High risk for breast cancer with family history of breast cancer: Still needs to have genetic testing done when she is pregnant.  Breast MRI was fortunately negative.  Follow-up  Assessment  1. Prehypertension   2. White coat syndrome without diagnosis of hypertension      Plan   We discussed the diagnosis of pre-hypertension.  We will continue with monitoring, low-salt diet, exercise and weight loss.  Recheck 6 months High risk breast cancer f/u: We will refer for genetic testing.  She is emotionally ready.  Continue high risk she has been screening. Education regarding management of these chronic disease states was given. Management strategies discussed on successive visits include dietary and exercise recommendations, goals of achieving and maintaining IBW, and lifestyle modifications aiming for adequate sleep and minimizing stressors.   Follow up: No follow-ups on file.  No orders of the defined types were placed in this encounter.  No orders of the defined types were placed in this encounter.     BP Readings from Last 3 Encounters:  04/20/21 139/72  11/27/20 130/84  10/19/20 (!) 142/90   Wt Readings from Last 3 Encounters:  04/20/21 194 lb 12.8 oz (88.4 kg)  11/27/20 186 lb 3.2 oz (84.5 kg)  10/19/20 185 lb 3.2 oz (84 kg)    Lab Results  Component Value Date   CHOL 212 (H) 10/19/2020   CHOL 210 (H) 08/11/2019   CHOL 201 (H) 02/23/2018   Lab  Results  Component Value Date   HDL 58 10/19/2020   HDL 54.50 08/11/2019   HDL 62.10 02/23/2018   Lab Results  Component Value Date   LDLCALC 135 (H) 10/19/2020   LDLCALC 140 (H) 08/11/2019   LDLCALC 121 (H) 02/23/2018   Lab Results  Component Value Date   TRIG 90 10/19/2020   TRIG 76.0 08/11/2019   TRIG 89.0 02/23/2018   Lab Results  Component Value Date   CHOLHDL 3.7 10/19/2020   CHOLHDL 4 08/11/2019   CHOLHDL 3 02/23/2018   No results found for: LDLDIRECT Lab Results  Component Value Date   CREATININE 0.83 10/19/2020   BUN 11 10/19/2020   NA 140 10/19/2020   K 4.4 10/19/2020   CL 103 10/19/2020   CO2 32 10/19/2020    The 10-year ASCVD risk score Mikey Bussing DC Jr., et al., 2013) is: 3.9%   Values used to calculate the score:     Age: 13 years     Sex: Female     Is Non-Hispanic African American: Yes     Diabetic: No     Tobacco smoker: No     Systolic Blood Pressure: 702 mmHg     Is BP treated: No     HDL Cholesterol: 58 mg/dL     Total Cholesterol: 212 mg/dL  I reviewed the patients updated PMH, FH, and SocHx.    Patient Active Problem List   Diagnosis Date Noted   White coat syndrome without  diagnosis of hypertension 04/20/2021   Prehypertension 10/19/2020   Family history of breast cancer in sister 10/19/2020   At high risk for breast cancer 10/19/2020   Benign essential microscopic hematuria 10/19/2020   Degenerative joint disease (DJD) of lumbar spine 06/08/2020   Exercise-induced asthma 02/18/2017   Migraine without aura 06/19/2007    Allergies: Patient has no known allergies.  Social History: Patient  reports that she has never smoked. She has never used smokeless tobacco. She reports that she does not drink alcohol and does not use drugs.  Current Meds  Medication Sig   acetaminophen (TYLENOL) 500 MG tablet Take 1,000 mg by mouth every 6 (six) hours as needed.   albuterol (PROVENTIL HFA;VENTOLIN HFA) 108 (90 Base) MCG/ACT inhaler One puff 30  minutes prior to exercise   Multiple Vitamin (MULTIVITAMIN) tablet Take 1 tablet by mouth daily.   OVER THE COUNTER MEDICATION Juice Plus   rizatriptan (MAXALT) 5 MG tablet Take 1 tablet (5 mg total) by mouth as needed. May repeat in 2 hours if needed    Review of Systems: Cardiovascular: negative for chest pain, palpitations, leg swelling, orthopnea Respiratory: negative for SOB, wheezing or persistent cough Gastrointestinal: negative for abdominal pain Genitourinary: negative for dysuria or gross hematuria  Objective  Vitals: BP 139/72 Comment: by consistent home readings  Pulse 71   Temp 98 F (36.7 C) (Temporal)   Ht 5\' 4"  (1.626 m)   Wt 194 lb 12.8 oz (88.4 kg)   SpO2 100%   BMI 33.44 kg/m  General: no acute distress  Psych:  Alert and oriented, normal mood and affect HEENT:  Normocephalic, atraumatic, supple neck  Cardiovascular:  RRR without murmur. no edema Respiratory:  Good breath sounds bilaterally, CTAB with normal respiratory effort  Commons side effects, risks, benefits, and alternatives for medications and treatment plan prescribed today were discussed, and the patient expressed understanding of the given instructions. Patient is instructed to call or message via MyChart if he/she has any questions or concerns regarding our treatment plan. No barriers to understanding were identified. We discussed Red Flag symptoms and signs in detail. Patient expressed understanding regarding what to do in case of urgent or emergency type symptoms.  Medication list was reconciled, printed and provided to the patient in AVS. Patient instructions and summary information was reviewed with the patient as documented in the AVS. This note was prepared with assistance of Dragon voice recognition software. Occasional wrong-word or sound-a-like substitutions may have occurred due to the inherent limitations of voice recognition software  This visit occurred during the SARS-CoV-2 public health  emergency.  Safety protocols were in place, including screening questions prior to the visit, additional usage of staff PPE, and extensive cleaning of exam room while observing appropriate contact time as indicated for disinfecting solutions.

## 2021-09-18 DIAGNOSIS — F4320 Adjustment disorder, unspecified: Secondary | ICD-10-CM | POA: Diagnosis not present

## 2021-09-18 DIAGNOSIS — Z634 Disappearance and death of family member: Secondary | ICD-10-CM | POA: Diagnosis not present

## 2021-10-02 ENCOUNTER — Ambulatory Visit (INDEPENDENT_AMBULATORY_CARE_PROVIDER_SITE_OTHER): Payer: BC Managed Care – PPO

## 2021-10-02 ENCOUNTER — Other Ambulatory Visit: Payer: Self-pay

## 2021-10-02 DIAGNOSIS — Z23 Encounter for immunization: Secondary | ICD-10-CM | POA: Diagnosis not present

## 2021-10-11 DIAGNOSIS — Z634 Disappearance and death of family member: Secondary | ICD-10-CM | POA: Diagnosis not present

## 2021-10-11 DIAGNOSIS — F4323 Adjustment disorder with mixed anxiety and depressed mood: Secondary | ICD-10-CM | POA: Diagnosis not present

## 2021-10-14 IMAGING — DX DG LUMBAR SPINE COMPLETE 4+V
5 series · 5 of 5 positions shown · non-contrast
Comparison: December 04, 2010

CLINICAL DATA: LEFT back pain

EXAM:
LUMBAR SPINE - COMPLETE 4+ VIEW

[l-spine ap]
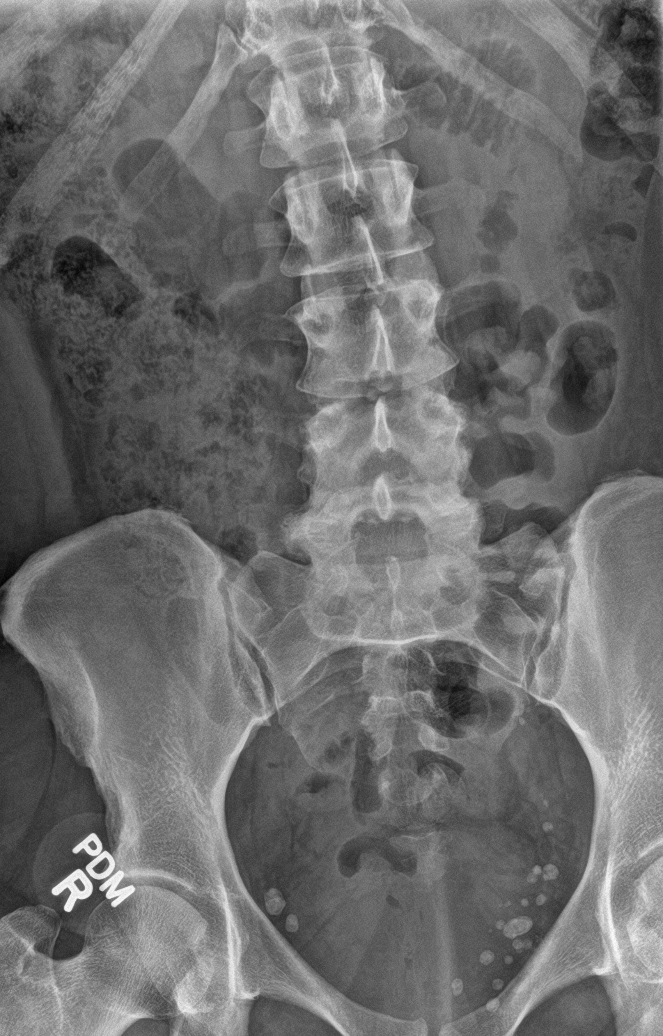

[l-spine obl (1 of 2)]
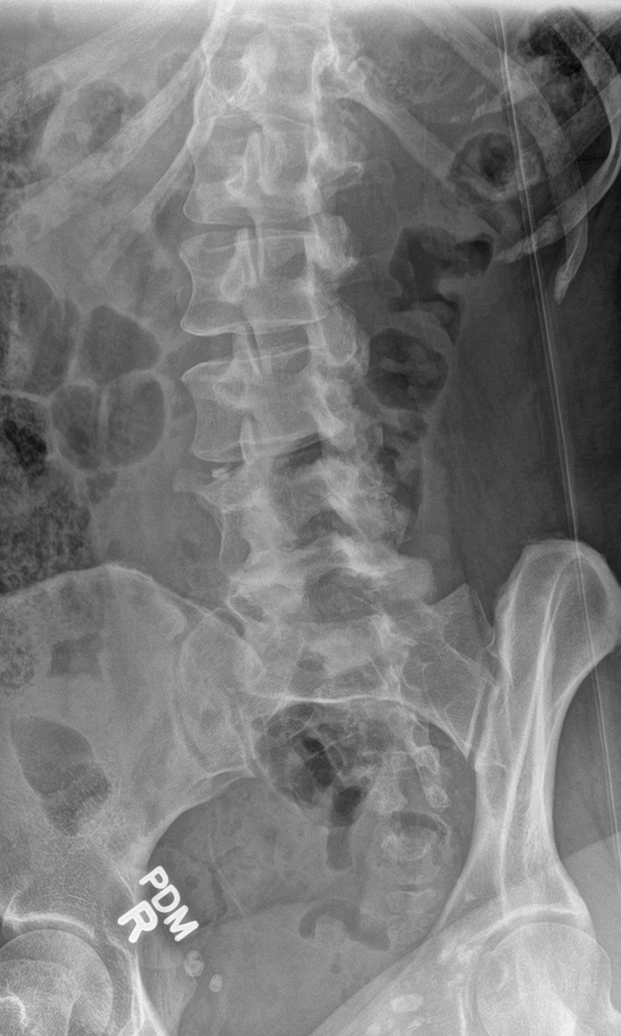

[l-spine obl (2 of 2)]
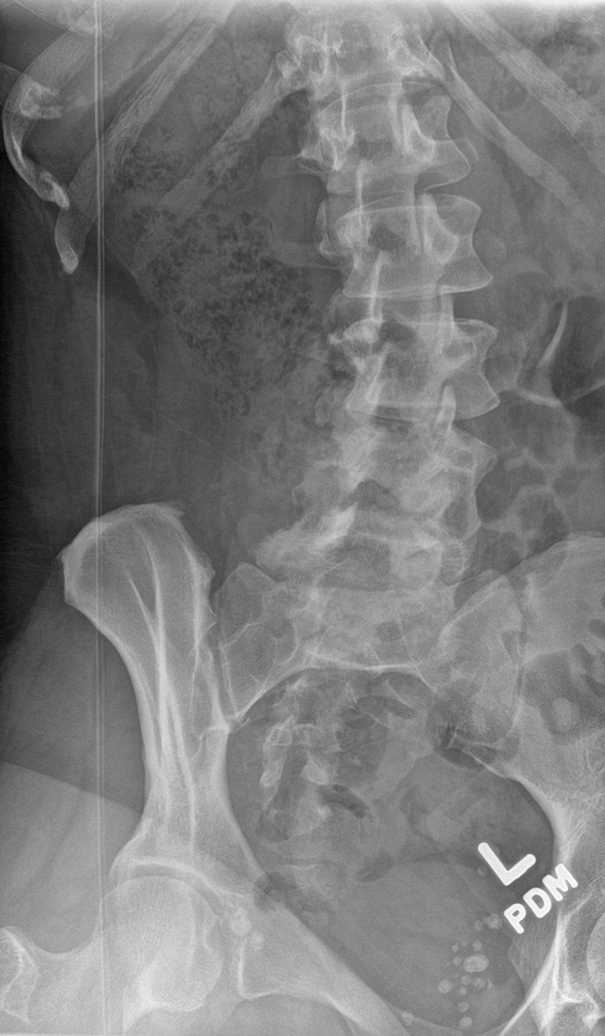

[l-spine lat]
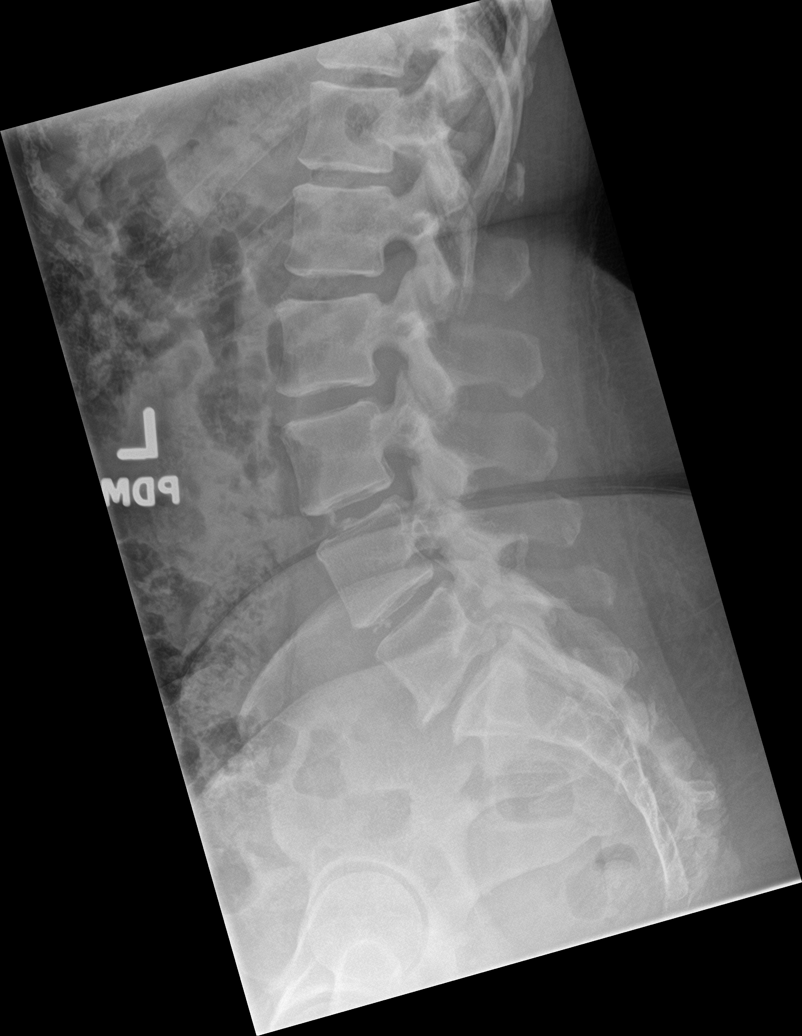

[l-spine spot]
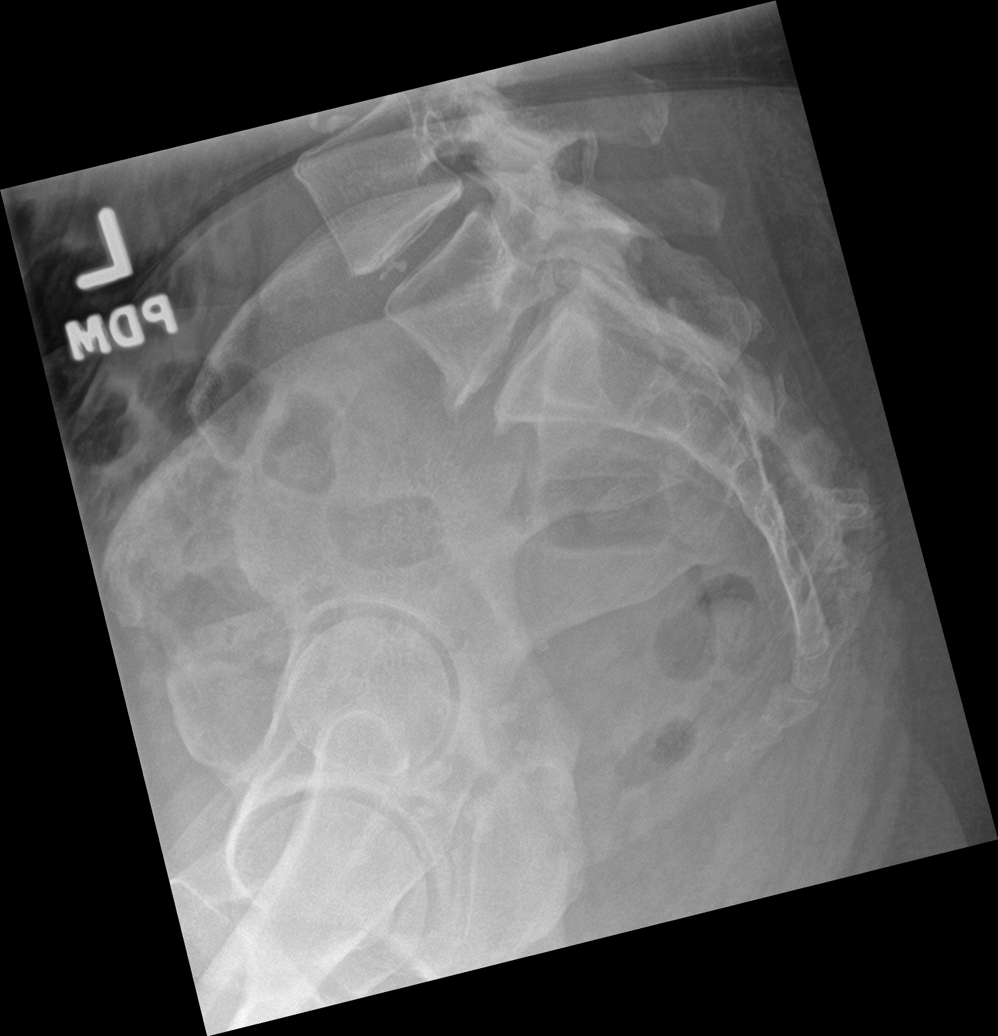

[5 of 5 positions shown; findings below may reference images not displayed]

FINDINGS: There are five non-rib bearing lumbar-type vertebral bodies. There
is normal alignment. There is no evidence for acute fracture or
subluxation. There is moderate intervertebral disc space height loss
with endplate proliferative changes at L5-S1. There is mild
intervertebral disc space height loss at L4-5. Lower lumbar facet
arthropathy. Pelvic phleboliths.
IMPRESSION: Mild-to-moderate degenerative changes most predominant in the lower
lumbar spine. No acute fracture or subluxation.

## 2021-10-22 ENCOUNTER — Encounter: Payer: BC Managed Care – PPO | Admitting: Family Medicine

## 2021-11-08 DIAGNOSIS — F4323 Adjustment disorder with mixed anxiety and depressed mood: Secondary | ICD-10-CM | POA: Diagnosis not present

## 2021-11-08 DIAGNOSIS — Z634 Disappearance and death of family member: Secondary | ICD-10-CM | POA: Diagnosis not present

## 2021-11-12 ENCOUNTER — Encounter: Payer: Self-pay | Admitting: Family Medicine

## 2021-11-12 ENCOUNTER — Other Ambulatory Visit: Payer: Self-pay

## 2021-11-12 ENCOUNTER — Ambulatory Visit (INDEPENDENT_AMBULATORY_CARE_PROVIDER_SITE_OTHER): Payer: BC Managed Care – PPO | Admitting: Family Medicine

## 2021-11-12 VITALS — BP 178/90 | HR 75 | Temp 98.7°F | Ht 64.0 in | Wt 200.0 lb

## 2021-11-12 DIAGNOSIS — Z0000 Encounter for general adult medical examination without abnormal findings: Secondary | ICD-10-CM | POA: Diagnosis not present

## 2021-11-12 DIAGNOSIS — M5432 Sciatica, left side: Secondary | ICD-10-CM | POA: Diagnosis not present

## 2021-11-12 DIAGNOSIS — Z23 Encounter for immunization: Secondary | ICD-10-CM

## 2021-11-12 DIAGNOSIS — Z9189 Other specified personal risk factors, not elsewhere classified: Secondary | ICD-10-CM | POA: Diagnosis not present

## 2021-11-12 DIAGNOSIS — H6123 Impacted cerumen, bilateral: Secondary | ICD-10-CM

## 2021-11-12 DIAGNOSIS — J45990 Exercise induced bronchospasm: Secondary | ICD-10-CM

## 2021-11-12 DIAGNOSIS — I10 Essential (primary) hypertension: Secondary | ICD-10-CM

## 2021-11-12 LAB — CBC WITH DIFFERENTIAL/PLATELET
Basophils Absolute: 0.1 10*3/uL (ref 0.0–0.1)
Basophils Relative: 1 % (ref 0.0–3.0)
Eosinophils Absolute: 0.4 10*3/uL (ref 0.0–0.7)
Eosinophils Relative: 5.6 % — ABNORMAL HIGH (ref 0.0–5.0)
HCT: 36.3 % (ref 36.0–46.0)
Hemoglobin: 12.1 g/dL (ref 12.0–15.0)
Lymphocytes Relative: 47.6 % — ABNORMAL HIGH (ref 12.0–46.0)
Lymphs Abs: 3 10*3/uL (ref 0.7–4.0)
MCHC: 33.3 g/dL (ref 30.0–36.0)
MCV: 90.5 fl (ref 78.0–100.0)
Monocytes Absolute: 0.4 10*3/uL (ref 0.1–1.0)
Monocytes Relative: 6 % (ref 3.0–12.0)
Neutro Abs: 2.5 10*3/uL (ref 1.4–7.7)
Neutrophils Relative %: 39.8 % — ABNORMAL LOW (ref 43.0–77.0)
Platelets: 300 10*3/uL (ref 150.0–400.0)
RBC: 4.02 Mil/uL (ref 3.87–5.11)
RDW: 13.4 % (ref 11.5–15.5)
WBC: 6.3 10*3/uL (ref 4.0–10.5)

## 2021-11-12 LAB — LIPID PANEL
Cholesterol: 206 mg/dL — ABNORMAL HIGH (ref 0–200)
HDL: 60.2 mg/dL (ref >39.00)
LDL Cholesterol: 128 mg/dL — ABNORMAL HIGH (ref 0–99)
NonHDL: 146.27
Total CHOL/HDL Ratio: 3
Triglycerides: 90 mg/dL (ref 0.0–149.0)
VLDL: 18 mg/dL (ref 0.0–40.0)

## 2021-11-12 LAB — COMPREHENSIVE METABOLIC PANEL
ALT: 15 U/L (ref 0–35)
AST: 19 U/L (ref 0–37)
Albumin: 4.1 g/dL (ref 3.5–5.2)
Alkaline Phosphatase: 62 U/L (ref 39–117)
BUN: 12 mg/dL (ref 6–23)
CO2: 32 mEq/L (ref 19–32)
Calcium: 9.4 mg/dL (ref 8.4–10.5)
Chloride: 102 mEq/L (ref 96–112)
Creatinine, Ser: 0.75 mg/dL (ref 0.40–1.20)
GFR: 90.01 mL/min (ref >60.00)
Glucose, Bld: 90 mg/dL (ref 70–99)
Potassium: 4.2 mEq/L (ref 3.5–5.1)
Sodium: 140 mEq/L (ref 135–145)
Total Bilirubin: 0.8 mg/dL (ref 0.2–1.2)
Total Protein: 7.2 g/dL (ref 6.0–8.3)

## 2021-11-12 LAB — TSH: TSH: 1.56 u[IU]/mL (ref 0.35–5.50)

## 2021-11-12 MED ORDER — CHLORTHALIDONE 25 MG PO TABS: 25.0000 mg | ORAL_TABLET | Freq: Every day | ORAL | 3 refills | Status: DC

## 2021-11-12 NOTE — Progress Notes (Signed)
Subjective  Chief Complaint  Patient presents with   Annual Exam    Non fasting   Leg Pain    Left leg nerve pain    HPI: Tiffany Beltran is a 56 y.o. female who presents to [facility] at [facility] today for a Female Wellness Visit. She also has the concerns and/or needs as listed above in the chief complaint. These will be addressed in addition to the Health Maintenance Visit.   Wellness Visit: annual visit with health maintenance review and exam without Pap  Health maintenance: High risk for breast cancer getting annual MRIs and mammograms.  Mammogram due in March.  MRI in June.  Feeling well overall.  However, her mother recently passed November.  She is appropriately grieving.  She is seeing a Social worker.  Eligible for Prevnar 20 due to asthma diagnosis.  Eligible for Shingrix. Chronic disease f/u and/or acute problem visit: (deemed necessary to be done in addition to the wellness visit): Blood pressure: Has been prehypertensive with whitecoat response.  Now elevated blood pressures at home.  Strong family history.  No chest pain or shortness of breath.  No edema.  Is hesitant to start medications. Exercise-induced asthma: Had URI last week.  No significant exacerbations Complains of left side sciatica pain.  Has history of left-sided sacroiliitis and sciatic pain.  Did have physical therapy.  No pain during the day but only if lies on right side at night.  She can resolve pain with positional changes of her left leg.  No weakness.  No bowel or bladder problems.  Not using pain medication. Complains of fullness in the ear due to wax.  Assessment  1. Annual physical exam   2. At high risk for breast cancer   3. Exercise-induced asthma   4. White coat syndrome with diagnosis of hypertension   5. Sciatica, left side   6. Bilateral impacted cerumen      Plan  Female Wellness Visit: Age appropriate Health Maintenance and Prevention measures were discussed with patient.  Included topics are cancer screening recommendations, ways to keep healthy (see AVS) including dietary and exercise recommendations, regular eye and dental care, use of seat belts, and avoidance of moderate alcohol use and tobacco use.  Patient to schedule mammogram BMI: discussed patient's BMI and encouraged positive lifestyle modifications to help get to or maintain a target BMI. HM needs and immunizations were addressed and ordered. See below for orders. See HM and immunization section for updates.  Vaccine counseling done.  Prevnar 20 today.  She will return for nurse visit for Shingrix vaccinations Routine labs and screening tests ordered including cmp, cbc and lipids where appropriate. Discussed recommendations regarding Vit D and calcium supplementation (see AVS)  Chronic disease management visit and/or acute problem visit: Hypertension with whitecoat syndrome: New diagnosis.  Education counseling done.  Recommend low-salt diet.  Recommend weight loss.  Start carvedilol 25 mg daily and recheck in 3 months.  Patient continue home monitoring due to whitecoat syndrome.  Check renal function electrolytes.  Screen for hyperlipidemia, patient is nonfasting High risk for breast cancer: Annual MRIs are scheduled Exercise-induced asthma stable.  Slight wheeze on exam today.  Patient will monitor for symptoms. Left-sided sciatica: Restart physical therapy home exercises.  She will follow-up with me if worsening. Cerumen impaction bilaterally: Resolved with irrigation.  Follow up: Return in about 3 months (around 02/10/2022) for follow up Hypertension.  Orders Placed This Encounter  Procedures   CBC with Differential/Platelet  Comprehensive metabolic panel   Lipid panel   TSH   Meds ordered this encounter  Medications   chlorthalidone (HYGROTON) 25 MG tablet    Sig: Take 1 tablet (25 mg total) by mouth daily.    Dispense:  90 tablet    Refill:  3      Body mass index is 34.33 kg/m. Wt  Readings from Last 3 Encounters:  11/12/21 200 lb (90.7 kg)  04/20/21 194 lb 12.8 oz (88.4 kg)  11/27/20 186 lb 3.2 oz (84.5 kg)     Patient Active Problem List   Diagnosis Date Noted   White coat syndrome with diagnosis of hypertension 04/20/2021    Priority: High   Family history of breast cancer in sister 10/19/2020   At high risk for breast cancer 10/19/2020   Benign essential microscopic hematuria 10/19/2020    2005 eval by urology. negative    Degenerative joint disease (DJD) of lumbar spine 06/08/2020    Xray 06/2020    Exercise-induced asthma 02/18/2017   Migraine without aura 06/19/2007   Health Maintenance  Topic Date Due   Pneumococcal Vaccine 47-22 Years old (1 - PCV) Never done   Zoster Vaccines- Shingrix (1 of 2) Never done   COVID-19 Vaccine (4 - Booster for Moderna series) 11/15/2020   MAMMOGRAM  02/01/2022   PAP SMEAR-Modifier  08/10/2024   TETANUS/TDAP  11/15/2024   COLONOSCOPY (Pts 45-71yrs Insurance coverage will need to be confirmed)  03/20/2027   INFLUENZA VACCINE  Completed   Hepatitis C Screening  Completed   HIV Screening  Completed   HPV VACCINES  Aged Out   Immunization History  Administered Date(s) Administered   DT (Pediatric) 07/20/2003   Influenza Inj Mdck Quad Pf 08/21/2017   Influenza,inj,Quad PF,6+ Mos 08/28/2018, 08/11/2019, 10/19/2020, 10/02/2021   Influenza-Unspecified 11/14/2017   Moderna Sars-Covid-2 Vaccination 02/17/2020, 03/18/2020, 09/20/2020   Td 11/05/2003   Tdap 11/15/2014   We updated and reviewed the patient's past history in detail and it is documented below. Allergies: Patient has No Known Allergies. Past Medical History Patient  has a past medical history of Allergy, Anemia, Arthritis, Asthma, Headache(784.0), Migraines, and Undiagnosed cardiac murmurs. Past Surgical History Patient  has no past surgical history on file. Family History: Patient family history includes Breast cancer (age of onset: 35) in her  sister; Glaucoma in her mother; Hypertension in an other family member. Social History:  Patient  reports that she has never smoked. She has never used smokeless tobacco. She reports that she does not drink alcohol and does not use drugs.  Review of Systems: Constitutional: negative for fever or malaise Ophthalmic: negative for photophobia, double vision or loss of vision Cardiovascular: negative for chest pain, dyspnea on exertion, or new LE swelling Respiratory: negative for SOB or persistent cough Gastrointestinal: negative for abdominal pain, change in bowel habits or melena Genitourinary: negative for dysuria or gross hematuria, no abnormal uterine bleeding or disharge Musculoskeletal: negative for new gait disturbance or muscular weakness Integumentary: negative for new or persistent rashes, no breast lumps Neurological: negative for TIA or stroke symptoms Psychiatric: negative for SI or delusions Allergic/Immunologic: negative for hives  Patient Care Team    Relationship Specialty Notifications Start End  Leamon Arnt, MD PCP - General Family Medicine  08/11/19     Objective  Vitals: BP (!) 178/90    Pulse 75    Temp 98.7 F (37.1 C) (Temporal)    Ht 5\' 4"  (1.626 m)    Wt 200  lb (90.7 kg)    SpO2 99%    BMI 34.33 kg/m  General:  Well developed, well nourished, no acute distress  Psych:  Alert and orientedx3,normal mood and affect doing okay. HEENT:  Normocephalic, atraumatic, non-icteric sclera,  supple neck without adenopathy, mass or thyromegaly Cardiovascular:  Normal S1, S2, RRR without gallop, rub or murmur Respiratory:  Good breath sounds bilaterally, CTAB with normal respiratory effort Gastrointestinal: normal bowel sounds, soft, non-tender, no noted masses. No HSM MSK: no deformities, contusions. Joints are without erythema or swelling.  Skin:  Warm, no rashes or suspicious lesions noted Neurologic:    Mental status is normal. CN 2-11 are normal. Gross motor and  sensory exams are normal. Normal gait. No tremor Breast Exam: No mass, skin retraction or nipple discharge is appreciated in either breast. No axillary adenopathy. Fibrocystic changes are not noted   Commons side effects, risks, benefits, and alternatives for medications and treatment plan prescribed today were discussed, and the patient expressed understanding of the given instructions. Patient is instructed to call or message via MyChart if he/she has any questions or concerns regarding our treatment plan. No barriers to understanding were identified. We discussed Red Flag symptoms and signs in detail. Patient expressed understanding regarding what to do in case of urgent or emergency type symptoms.  Medication list was reconciled, printed and provided to the patient in AVS. Patient instructions and summary information was reviewed with the patient as documented in the AVS. This note was prepared with assistance of Dragon voice recognition software. Occasional wrong-word or sound-a-like substitutions may have occurred due to the inherent limitations of voice recognition software  This visit occurred during the SARS-CoV-2 public health emergency.  Safety protocols were in place, including screening questions prior to the visit, additional usage of staff PPE, and extensive cleaning of exam room while observing appropriate contact time as indicated for disinfecting solutions.

## 2021-11-12 NOTE — Addendum Note (Signed)
Addended by: Verne Grain on: 11/12/2021 10:49 AM   Modules accepted: Orders

## 2021-11-12 NOTE — Patient Instructions (Signed)
Please return in 3 months to recheck blood pressure.   I will release your lab results to you on your MyChart account with further instructions. Please reply with any questions.    Start the blood pressure pill daily. Let me know if you have any problems. You shouldn't.   Today you were given Prevnar 20 vaccination.  If you have any questions or concerns, please don't hesitate to send me a message via MyChart or call the office at [phone]. Thank you for visiting with [place] today! It's our pleasure caring for you.   Hypertension, Adult High blood pressure (hypertension) is when the force of blood pumping through the arteries is too strong. The arteries are the blood vessels that carry blood from the heart throughout the body. Hypertension forces the heart to work harder to pump blood and may cause arteries to become narrow or stiff. Untreated or uncontrolled hypertension can cause a heart attack, heart failure, a stroke, kidney disease, and other problems. A blood pressure reading consists of a higher number over a lower number. Ideally, your blood pressure should be below 120/80. The first ("top") number is called the systolic pressure. It is a measure of the pressure in your arteries as your heart beats. The second ("bottom") number is called the diastolic pressure. It is a measure of the pressure in your arteries as the heart relaxes. What are the causes? The exact cause of this condition is not known. There are some conditions that result in or are related to high blood pressure. What increases the risk? Some risk factors for high blood pressure are under your control. The following factors may make you more likely to develop this condition: Smoking. Having type 2 diabetes mellitus, high cholesterol, or both. Not getting enough exercise or physical activity. Being overweight. Having too much fat, sugar, calories, or salt (sodium) in your diet. Drinking too much alcohol. Some risk factors  for high blood pressure may be difficult or impossible to change. Some of these factors include: Having chronic kidney disease. Having a family history of high blood pressure. Age. Risk increases with age. Race. You may be at higher risk if you are African American. Gender. Men are at higher risk than women before age 38. After age 53, women are at higher risk than men. Having obstructive sleep apnea. Stress. What are the signs or symptoms? High blood pressure may not cause symptoms. Very high blood pressure (hypertensive crisis) may cause: Headache. Anxiety. Shortness of breath. Nosebleed. Nausea and vomiting. Vision changes. Severe chest pain. Seizures. How is this diagnosed? This condition is diagnosed by measuring your blood pressure while you are seated, with your arm resting on a flat surface, your legs uncrossed, and your feet flat on the floor. The cuff of the blood pressure monitor will be placed directly against the skin of your upper arm at the level of your heart. It should be measured at least twice using the same arm. Certain conditions can cause a difference in blood pressure between your right and left arms. Certain factors can cause blood pressure readings to be lower or higher than normal for a short period of time: When your blood pressure is higher when you are in a health care provider's office than when you are at home, this is called white coat hypertension. Most people with this condition do not need medicines. When your blood pressure is higher at home than when you are in a health care provider's office, this is called masked hypertension.  Most people with this condition may need medicines to control blood pressure. If you have a high blood pressure reading during one visit or you have normal blood pressure with other risk factors, you may be asked to: Return on a different day to have your blood pressure checked again. Monitor your blood pressure at home for 1 week  or longer. If you are diagnosed with hypertension, you may have other blood or imaging tests to help your health care provider understand your overall risk for other conditions. How is this treated? This condition is treated by making healthy lifestyle changes, such as eating healthy foods, exercising more, and reducing your alcohol intake. Your health care provider may prescribe medicine if lifestyle changes are not enough to get your blood pressure under control, and if: Your systolic blood pressure is above 130. Your diastolic blood pressure is above 80. Your personal target blood pressure may vary depending on your medical conditions, your age, and other factors. Follow these instructions at home: Eating and drinking  Eat a diet that is high in fiber and potassium, and low in sodium, added sugar, and fat. An example eating plan is called the DASH (Dietary Approaches to Stop Hypertension) diet. To eat this way: Eat plenty of fresh fruits and vegetables. Try to fill one half of your plate at each meal with fruits and vegetables. Eat whole grains, such as whole-wheat pasta, brown rice, or whole-grain bread. Fill about one fourth of your plate with whole grains. Eat or drink low-fat dairy products, such as skim milk or low-fat yogurt. Avoid fatty cuts of meat, processed or cured meats, and poultry with skin. Fill about one fourth of your plate with lean proteins, such as fish, chicken without skin, beans, eggs, or tofu. Avoid pre-made and processed foods. These tend to be higher in sodium, added sugar, and fat. Reduce your daily sodium intake. Most people with hypertension should eat less than 1,500 mg of sodium a day. Do not drink alcohol if: Your health care provider tells you not to drink. You are pregnant, may be pregnant, or are planning to become pregnant. If you drink alcohol: Limit how much you use to: 0-1 drink a day for women. 0-2 drinks a day for men. Be aware of how much alcohol  is in your drink. In the [place] one drink equals one 12 oz bottle of beer (355 mL), one 5 oz glass of wine (148 mL), or one 1 oz glass of hard liquor (44 mL). Lifestyle  Work with your health care provider to maintain a healthy body weight or to lose weight. Ask what an ideal weight is for you. Get at least 30 minutes of exercise most days of the week. Activities may include walking, swimming, or biking. Include exercise to strengthen your muscles (resistance exercise), such as Pilates or lifting weights, as part of your weekly exercise routine. Try to do these types of exercises for 30 minutes at least 3 days a week. Do not use any products that contain nicotine or tobacco, such as cigarettes, e-cigarettes, and chewing tobacco. If you need help quitting, ask your health care provider. Monitor your blood pressure at home as told by your health care provider. Keep all follow-up visits as told by your health care provider. This is important. Medicines Take over-the-counter and prescription medicines only as told by your health care provider. Follow directions carefully. Blood pressure medicines must be taken as prescribed. Do not skip doses of blood pressure medicine. Doing this puts  you at risk for problems and can make the medicine less effective. Ask your health care provider about side effects or reactions to medicines that you should watch for. Contact a health care provider if you: Think you are having a reaction to a medicine you are taking. Have headaches that keep coming back (recurring). Feel dizzy. Have swelling in your ankles. Have trouble with your vision. Get help right away if you: Develop a severe headache or confusion. Have unusual weakness or numbness. Feel faint. Have severe pain in your chest or abdomen. Vomit repeatedly. Have trouble breathing. Summary Hypertension is when the force of blood pumping through your arteries is too strong. If this condition is not  controlled, it may put you at risk for serious complications. Your personal target blood pressure may vary depending on your medical conditions, your age, and other factors. For most people, a normal blood pressure is less than 120/80. Hypertension is treated with lifestyle changes, medicines, or a combination of both. Lifestyle changes include losing weight, eating a healthy, low-sodium diet, exercising more, and limiting alcohol. This information is not intended to replace advice given to you by your health care provider. Make sure you discuss any questions you have with your health care provider. Document Revised: 07/01/2018 Document Reviewed: 07/01/2018 Elsevier Patient Education  [employer].

## 2021-11-21 ENCOUNTER — Other Ambulatory Visit: Payer: Self-pay | Admitting: Family Medicine

## 2021-11-21 DIAGNOSIS — Z1231 Encounter for screening mammogram for malignant neoplasm of breast: Secondary | ICD-10-CM

## 2021-11-26 ENCOUNTER — Other Ambulatory Visit: Payer: Self-pay | Admitting: Family Medicine

## 2021-11-26 DIAGNOSIS — Z9189 Other specified personal risk factors, not elsewhere classified: Secondary | ICD-10-CM

## 2021-11-30 DIAGNOSIS — Z634 Disappearance and death of family member: Secondary | ICD-10-CM | POA: Diagnosis not present

## 2021-11-30 DIAGNOSIS — F4323 Adjustment disorder with mixed anxiety and depressed mood: Secondary | ICD-10-CM | POA: Diagnosis not present

## 2021-12-07 ENCOUNTER — Other Ambulatory Visit: Payer: Self-pay

## 2021-12-07 ENCOUNTER — Ambulatory Visit (INDEPENDENT_AMBULATORY_CARE_PROVIDER_SITE_OTHER): Payer: BC Managed Care – PPO

## 2021-12-07 DIAGNOSIS — Z23 Encounter for immunization: Secondary | ICD-10-CM | POA: Diagnosis not present

## 2021-12-15 ENCOUNTER — Other Ambulatory Visit: Payer: Self-pay

## 2021-12-15 ENCOUNTER — Ambulatory Visit
Admission: RE | Admit: 2021-12-15 | Discharge: 2021-12-15 | Disposition: A | Discharge status: Home / Self Care | Payer: BC Managed Care – PPO | Source: Ambulatory Visit | Attending: Family Medicine | Admitting: Family Medicine

## 2021-12-15 DIAGNOSIS — Z9189 Other specified personal risk factors, not elsewhere classified: Secondary | ICD-10-CM

## 2021-12-15 DIAGNOSIS — Z1239 Encounter for other screening for malignant neoplasm of breast: Secondary | ICD-10-CM | POA: Diagnosis not present

## 2021-12-15 DIAGNOSIS — Z803 Family history of malignant neoplasm of breast: Secondary | ICD-10-CM | POA: Diagnosis not present

## 2021-12-15 MED ORDER — GADOBUTROL 1 MMOL/ML IV SOLN
9.0000 mL | Freq: Once | INTRAVENOUS | Status: AC | PRN
  Administered 2021-12-15: 9 mL via INTRAVENOUS

## 2021-12-21 DIAGNOSIS — F4323 Adjustment disorder with mixed anxiety and depressed mood: Secondary | ICD-10-CM | POA: Diagnosis not present

## 2021-12-21 DIAGNOSIS — Z634 Disappearance and death of family member: Secondary | ICD-10-CM | POA: Diagnosis not present

## 2022-01-10 ENCOUNTER — Other Ambulatory Visit: Payer: Self-pay

## 2022-01-10 ENCOUNTER — Encounter: Payer: Self-pay | Admitting: Family Medicine

## 2022-01-10 DIAGNOSIS — J45901 Unspecified asthma with (acute) exacerbation: Secondary | ICD-10-CM

## 2022-01-10 DIAGNOSIS — Z634 Disappearance and death of family member: Secondary | ICD-10-CM | POA: Diagnosis not present

## 2022-01-10 DIAGNOSIS — F4323 Adjustment disorder with mixed anxiety and depressed mood: Secondary | ICD-10-CM | POA: Diagnosis not present

## 2022-01-14 ENCOUNTER — Other Ambulatory Visit: Payer: Self-pay

## 2022-01-14 MED ORDER — ALBUTEROL SULFATE HFA 108 (90 BASE) MCG/ACT IN AERS: INHALATION_SPRAY | RESPIRATORY_TRACT | 2 refills | Status: DC

## 2022-02-04 ENCOUNTER — Ambulatory Visit
Admission: RE | Admit: 2022-02-04 | Discharge: 2022-02-04 | Disposition: A | Discharge status: Home / Self Care | Payer: BC Managed Care – PPO | Source: Ambulatory Visit

## 2022-02-04 DIAGNOSIS — Z1231 Encounter for screening mammogram for malignant neoplasm of breast: Secondary | ICD-10-CM

## 2022-02-13 DIAGNOSIS — Z634 Disappearance and death of family member: Secondary | ICD-10-CM | POA: Diagnosis not present

## 2022-02-13 DIAGNOSIS — F4323 Adjustment disorder with mixed anxiety and depressed mood: Secondary | ICD-10-CM | POA: Diagnosis not present

## 2022-02-18 ENCOUNTER — Other Ambulatory Visit: Payer: Self-pay

## 2022-02-18 ENCOUNTER — Encounter: Payer: Self-pay | Admitting: Family Medicine

## 2022-02-18 ENCOUNTER — Ambulatory Visit (INDEPENDENT_AMBULATORY_CARE_PROVIDER_SITE_OTHER): Payer: BC Managed Care – PPO | Admitting: Family Medicine

## 2022-02-18 VITALS — BP 168/94 | HR 81 | Temp 98.2°F | Ht 64.0 in | Wt 196.2 lb

## 2022-02-18 DIAGNOSIS — Z23 Encounter for immunization: Secondary | ICD-10-CM | POA: Diagnosis not present

## 2022-02-18 DIAGNOSIS — I10 Essential (primary) hypertension: Secondary | ICD-10-CM | POA: Diagnosis not present

## 2022-02-18 DIAGNOSIS — J45990 Exercise induced bronchospasm: Secondary | ICD-10-CM | POA: Diagnosis not present

## 2022-02-18 MED ORDER — ALBUTEROL SULFATE HFA 108 (90 BASE) MCG/ACT IN AERS: INHALATION_SPRAY | RESPIRATORY_TRACT | 2 refills | Status: AC

## 2022-02-18 MED ORDER — CHLORTHALIDONE 25 MG PO TABS: 25.0000 mg | ORAL_TABLET | Freq: Every day | ORAL | 3 refills | Status: DC

## 2022-02-18 NOTE — Patient Instructions (Signed)
Please return in 6-8 weeks to recheck blood pressure and labs. ? ?Good luck with the fluid pill and keep checking your numbers at home and bring in the log for review. We should see a good blood pressure lowering response! ? ?If you have any questions or concerns, please don't hesitate to send me a message via MyChart or call the office at [phone]. Thank you for visiting with [place] today! It's our pleasure caring for you.  ?

## 2022-02-18 NOTE — Progress Notes (Signed)
? ?Subjective  ?CC:  ?Chief Complaint  ?Patient presents with  ? Hypertension  ?  Pt stated that she has been monitoring her Bp, exercising and eating right.  ? ? ?HPI: Tiffany Beltran is a 56 y.o. female who presents to the office today to address the problems listed above in the chief complaint. ?Hypertension f/u: Here for follow-up on hypertension.  has whitecoat response as well.  Not currently on medication.  Home blood pressure readings remain elevated averaging 140s to 150s over 80s to 90s.  No associated symptoms or complications.  She has been working hard on therapeutic lifestyle changes including exercising 4 times per week, decrease salt intake.  Healthier food choices.  Her weight is down 4 pounds.  Overall feels well.  She still struggles with the idea of needing to take blood pressure medications.  She is seeing a therapist.  Still with grief counseling.  Had long discussion.  She witnessed her mother and sister take multiple medications nearing the end of their lives.  This remains a trigger for her. ?Exercise-induced asthma: Needs refill of her albuterol.  Rare use.  Mainly needs to use when she is exercising outdoors.  No recent exacerbations. ?Second Shingrix due today.  She tolerated the first well ? ?Assessment  ?1. White coat syndrome with diagnosis of hypertension   ?2. Exercise-induced asthma   ?3. Need for vaccination   ? ?  ?Plan  ? ?Hypertension f/u: BP control is poorly controlled.  Discussed diagnosis of hypertension, etiology and treatment options.  Start chlorthalidone 25 mg daily.  Recheck in 6 to 8 weeks with BMP.  Continue healthy lifestyle changes. ?Asthma f/u: Stable.  Refilled albuterol ?Shingrix No. 2 given today. ?Education regarding management of these chronic disease states was given. Management strategies discussed on successive visits include dietary and exercise recommendations, goals of achieving and maintaining IBW, and lifestyle modifications aiming for adequate sleep and  minimizing stressors.  ? ?Follow up: 6-8 weeks for bp check and bmp ? ?Orders Placed This Encounter  ?Procedures  ? Varicella-zoster vaccine IM (Shingrix)  ? ?Meds ordered this encounter  ?Medications  ? chlorthalidone (HYGROTON) 25 MG tablet  ?  Sig: Take 1 tablet (25 mg total) by mouth daily.  ?  Dispense:  90 tablet  ?  Refill:  3  ? albuterol (VENTOLIN HFA) 108 (90 Base) MCG/ACT inhaler  ?  Sig: One puff 30 minutes prior to exercise  ?  Dispense:  18 g  ?  Refill:  2  ? ?  ? ?BP Readings from Last 3 Encounters:  ?02/18/22 (!) 168/94  ?11/12/21 (!) 178/90  ?04/20/21 139/72  ? ?Wt Readings from Last 3 Encounters:  ?02/18/22 196 lb 3.2 oz (89 kg)  ?11/12/21 200 lb (90.7 kg)  ?04/20/21 194 lb 12.8 oz (88.4 kg)  ? ? ?Lab Results  ?Component Value Date  ? CHOL 206 (H) 11/12/2021  ? CHOL 212 (H) 10/19/2020  ? CHOL 210 (H) 08/11/2019  ? ?Lab Results  ?Component Value Date  ? HDL 60.20 11/12/2021  ? HDL 58 10/19/2020  ? HDL 54.50 08/11/2019  ? ?Lab Results  ?Component Value Date  ? LDLCALC 128 (H) 11/12/2021  ? LDLCALC 135 (H) 10/19/2020  ? LDLCALC 140 (H) 08/11/2019  ? ?Lab Results  ?Component Value Date  ? TRIG 90.0 11/12/2021  ? TRIG 90 10/19/2020  ? TRIG 76.0 08/11/2019  ? ?Lab Results  ?Component Value Date  ? CHOLHDL 3 11/12/2021  ? CHOLHDL 3.7 10/19/2020  ?  CHOLHDL 4 08/11/2019  ? ?No results found for: LDLDIRECT ?Lab Results  ?Component Value Date  ? CREATININE 0.75 11/12/2021  ? BUN 12 11/12/2021  ? NA 140 11/12/2021  ? K 4.2 11/12/2021  ? CL 102 11/12/2021  ? CO2 32 11/12/2021  ? ? ?The 10-year ASCVD risk score (Arnett DK, et al., 2019) is: 11.6% ?  Values used to calculate the score: ?    Age: 7 years ?    Sex: Female ?    Is Non-Hispanic African American: Yes ?    Diabetic: No ?    Tobacco smoker: No ?    Systolic Blood Pressure: 579 mmHg ?    Is BP treated: Yes ?    HDL Cholesterol: 60.2 mg/dL ?    Total Cholesterol: 206 mg/dL ? ?I reviewed the patients updated PMH, FH, and SocHx.  ?  ?Patient Active  Problem List  ? Diagnosis Date Noted  ? White coat syndrome with diagnosis of hypertension 04/20/2021  ?  Priority: High  ? Family history of breast cancer in sister 10/19/2020  ? At high risk for breast cancer 10/19/2020  ? Benign essential microscopic hematuria 10/19/2020  ? Degenerative joint disease (DJD) of lumbar spine 06/08/2020  ? Exercise-induced asthma 02/18/2017  ? Migraine without aura 06/19/2007  ? ? ?Allergies: Patient has no known allergies. ? ?Social History: ?Patient  reports that she has never smoked. She has never used smokeless tobacco. She reports that she does not drink alcohol and does not use drugs. ? ?No outpatient medications have been marked as taking for the 02/18/22 encounter (Office Visit) with Leamon Arnt, MD.  ? ? ?Review of Systems: ?Cardiovascular: negative for chest pain, palpitations, leg swelling, orthopnea ?Respiratory: negative for SOB, wheezing or persistent cough ?Gastrointestinal: negative for abdominal pain ?Genitourinary: negative for dysuria or gross hematuria ? ?Objective  ?Vitals: BP (!) 168/94   Pulse 81   Temp 98.2 ?F (36.8 ?C)   Ht '5\' 4"'$  (1.626 m)   Wt 196 lb 3.2 oz (89 kg)   SpO2 98%   BMI 33.68 kg/m?  ?General: no acute distress  ?Cardiovascular:  RRR without murmur. no edema ?Respiratory:  Good breath sounds bilaterally, CTAB with normal respiratory effort ?Neurologic:   Mental status is normal ?Commons side effects, risks, benefits, and alternatives for medications and treatment plan prescribed today were discussed, and the patient expressed understanding of the given instructions. Patient is instructed to call or message via MyChart if he/she has any questions or concerns regarding our treatment plan. No barriers to understanding were identified. We discussed Red Flag symptoms and signs in detail. Patient expressed understanding regarding what to do in case of urgent or emergency type symptoms.  ?Medication list was reconciled, printed and provided to  the patient in AVS. Patient instructions and summary information was reviewed with the patient as documented in the AVS. ?This note was prepared with assistance of Systems analyst. Occasional wrong-word or sound-a-like substitutions may have occurred due to the inherent limitations of voice recognition software ? ?This visit occurred during the SARS-CoV-2 public health emergency.  Safety protocols were in place, including screening questions prior to the visit, additional usage of staff PPE, and extensive cleaning of exam room while observing appropriate contact time as indicated for disinfecting solutions.  ?

## 2022-02-20 ENCOUNTER — Telehealth: Payer: Self-pay

## 2022-02-20 ENCOUNTER — Encounter: Payer: Self-pay | Admitting: Family Medicine

## 2022-02-20 NOTE — Telephone Encounter (Signed)
Message has been address with pt and with Dr. Jonni Sanger. ?

## 2022-02-20 NOTE — Telephone Encounter (Signed)
Please call patient:  ?Chest pain should not be coming from medication.  ? ?If having chest pain with nausea, may need to get evaluated at ER. ? ?What is her blood pressure? ? ?Hold bp medication and schedule her in am unless she is having chest pain that needs urgent evaluation.  ? ?thanks ?

## 2022-02-20 NOTE — Telephone Encounter (Signed)
Dr. Jonni Sanger, please see message from patient and advise if need to go to ED or Urgent care to be evaluated. ?

## 2022-02-20 NOTE — Telephone Encounter (Signed)
Spoke with pt to see how she was feeling. Pt stated that she feels a little better. Her chest was feeling very heavy but she says thay it has gone. Jonni Sanger offered her an appt  and was willing to work her in either on 02/21/22 02/22/2022 but pt deferred for now and stated that she would let us know. Her Bp was 123/75 ?

## 2022-02-22 ENCOUNTER — Ambulatory Visit (INDEPENDENT_AMBULATORY_CARE_PROVIDER_SITE_OTHER): Payer: BC Managed Care – PPO | Admitting: Family Medicine

## 2022-02-22 VITALS — BP 150/78 | HR 75 | Temp 98.0°F | Ht 64.0 in | Wt 192.6 lb

## 2022-02-22 DIAGNOSIS — I10 Essential (primary) hypertension: Secondary | ICD-10-CM

## 2022-02-22 DIAGNOSIS — R072 Precordial pain: Secondary | ICD-10-CM

## 2022-02-22 DIAGNOSIS — E782 Mixed hyperlipidemia: Secondary | ICD-10-CM | POA: Insufficient documentation

## 2022-02-22 LAB — CBC WITH DIFFERENTIAL/PLATELET
Basophils Absolute: 0.1 10*3/uL (ref 0.0–0.1)
Basophils Relative: 1.2 % (ref 0.0–3.0)
Eosinophils Absolute: 0.7 10*3/uL (ref 0.0–0.7)
Eosinophils Relative: 10.6 % — ABNORMAL HIGH (ref 0.0–5.0)
HCT: 39.1 % (ref 36.0–46.0)
Hemoglobin: 12.9 g/dL (ref 12.0–15.0)
Lymphocytes Relative: 49 % — ABNORMAL HIGH (ref 12.0–46.0)
Lymphs Abs: 3.3 10*3/uL (ref 0.7–4.0)
MCHC: 33 g/dL (ref 30.0–36.0)
MCV: 91.9 fl (ref 78.0–100.0)
Monocytes Absolute: 0.5 10*3/uL (ref 0.1–1.0)
Monocytes Relative: 7.5 % (ref 3.0–12.0)
Neutro Abs: 2.1 10*3/uL (ref 1.4–7.7)
Neutrophils Relative %: 31.7 % — ABNORMAL LOW (ref 43.0–77.0)
Platelets: 314 10*3/uL (ref 150.0–400.0)
RBC: 4.25 Mil/uL (ref 3.87–5.11)
RDW: 13.1 % (ref 11.5–15.5)
WBC: 6.7 10*3/uL (ref 4.0–10.5)

## 2022-02-22 LAB — COMPREHENSIVE METABOLIC PANEL
ALT: 11 U/L (ref 0–35)
AST: 16 U/L (ref 0–37)
Albumin: 4.3 g/dL (ref 3.5–5.2)
Alkaline Phosphatase: 62 U/L (ref 39–117)
BUN: 16 mg/dL (ref 6–23)
CO2: 33 mEq/L — ABNORMAL HIGH (ref 19–32)
Calcium: 9.9 mg/dL (ref 8.4–10.5)
Chloride: 98 mEq/L (ref 96–112)
Creatinine, Ser: 0.91 mg/dL (ref 0.40–1.20)
GFR: 71.23 mL/min (ref >60.00)
Glucose, Bld: 101 mg/dL — ABNORMAL HIGH (ref 70–99)
Potassium: 3.7 mEq/L (ref 3.5–5.1)
Sodium: 139 mEq/L (ref 135–145)
Total Bilirubin: 0.8 mg/dL (ref 0.2–1.2)
Total Protein: 7.7 g/dL (ref 6.0–8.3)

## 2022-02-22 NOTE — Patient Instructions (Signed)
Please follow up as scheduled for your next visit with me: 04/02/2022  ? ?If you have any questions or concerns, please don't hesitate to send me a message via MyChart or call the office at [phone]. Thank you for visiting with [place] today! It's our pleasure caring for you.  ? ?We will call you to get you scheduled for a cardiac treadmill stress test to ensure your heart is ok. ?Continue your blood pressure medication daily.  ? ?

## 2022-02-22 NOTE — Progress Notes (Signed)
? ? ?Subjective  ?CC:  ?Chief Complaint  ?Patient presents with  ? Chest Pain  ?  Pt stated that she was feeling some heaviness in her chest over the past couple of days but now she does not feel it as much. Now, she did c/o a little pain that is close to her shoulder blade and she was not sure if if was bc of her lifting wts. Just wanted to F/U to shee waht provider thinks.   ? ? ?HPI: Tiffany Beltran is a 55 y.o. female who presents to the office today to address the problems listed above in the chief complaint. ?56 year old with hypertension and whitecoat syndrome presents due to 2 episodes of substernal chest pressure that occurred about 48 hours ago.  She was at work, at rest, and felt mild nausea.  Then had associated substernal chest pressure.  That morning she took her first dose of chlorthalidone.  Thought it might be related to that.  She stepped out from the needing eventually pain subsided.  There were no exertional effort, shortness of breath, GERD or GI symptoms.  She did feel better after drinking water.  Later that evening she had a similar episode.  None since.  No prior history of heart disease or chest pain.  She feels well today.  She does not have diabetes but her atherosclerotic risk score is elevated and her LDL is mildly high..  She is a non-smoker.  She does workout but does not feel that she has strained herself ?Hypertension f/u: See last note: Started chlorthalidone 2 days ago. ? ?Assessment  ?1. Substernal chest pain   ?2. White coat syndrome with diagnosis of hypertension   ?3. Mixed hyperlipidemia   ? ?  ?Plan  ?Substernal chest pain: Risk factors of hypertension hyperlipidemia.  Nonspecific T wave abnormality on EKG without comparison.  Recommend treadmill stress testing.  Recommend ER evaluation for further episodes of chest pain. ?Hypertension f/u: Doubt symptoms are related to new blood pressure medicine, chlorthalidone.  We will continue 25 mg daily. ?Hyperlipidemia f/u: She is a  candidate for statins.  Will defer for now given the above symptoms and just starting blood pressure medications.  Did discuss low-fat diet and need for follow-up. ? ?Education regarding management of these chronic disease states was given. Management strategies discussed on successive visits include dietary and exercise recommendations, goals of achieving and maintaining IBW, and lifestyle modifications aiming for adequate sleep and minimizing stressors.  ? ?Follow up: As scheduled for blood pressure recheck ? ?Orders Placed This Encounter  ?Procedures  ? EKG 12-Lead  ? ?No orders of the defined types were placed in this encounter. ? ?  ? ?BP Readings from Last 3 Encounters:  ?02/22/22 (!) 150/78  ?02/18/22 (!) 168/94  ?11/12/21 (!) 178/90  ? ?Wt Readings from Last 3 Encounters:  ?02/22/22 192 lb 9.6 oz (87.4 kg)  ?02/18/22 196 lb 3.2 oz (89 kg)  ?11/12/21 200 lb (90.7 kg)  ? ? ?Lab Results  ?Component Value Date  ? CHOL 206 (H) 11/12/2021  ? CHOL 212 (H) 10/19/2020  ? CHOL 210 (H) 08/11/2019  ? ?Lab Results  ?Component Value Date  ? HDL 60.20 11/12/2021  ? HDL 58 10/19/2020  ? HDL 54.50 08/11/2019  ? ?Lab Results  ?Component Value Date  ? LDLCALC 128 (H) 11/12/2021  ? LDLCALC 135 (H) 10/19/2020  ? LDLCALC 140 (H) 08/11/2019  ? ?Lab Results  ?Component Value Date  ? TRIG 90.0 11/12/2021  ? TRIG  90 10/19/2020  ? TRIG 76.0 08/11/2019  ? ?Lab Results  ?Component Value Date  ? CHOLHDL 3 11/12/2021  ? CHOLHDL 3.7 10/19/2020  ? CHOLHDL 4 08/11/2019  ? ?No results found for: LDLDIRECT ?Lab Results  ?Component Value Date  ? CREATININE 0.75 11/12/2021  ? BUN 12 11/12/2021  ? NA 140 11/12/2021  ? K 4.2 11/12/2021  ? CL 102 11/12/2021  ? CO2 32 11/12/2021  ? ? ?The 10-year ASCVD risk score (Arnett DK, et al., 2019) is: 8% ?  Values used to calculate the score: ?    Age: 32 years ?    Sex: Female ?    Is Non-Hispanic African American: Yes ?    Diabetic: No ?    Tobacco smoker: No ?    Systolic Blood Pressure: 188 mmHg ?    Is  BP treated: Yes ?    HDL Cholesterol: 60.2 mg/dL ?    Total Cholesterol: 206 mg/dL ? ?I reviewed the patients updated PMH, FH, and SocHx.  ?  ?Patient Active Problem List  ? Diagnosis Date Noted  ? White coat syndrome with diagnosis of hypertension 04/20/2021  ?  Priority: High  ? Mixed hyperlipidemia 02/22/2022  ? Family history of breast cancer in sister 10/19/2020  ? At high risk for breast cancer 10/19/2020  ? Benign essential microscopic hematuria 10/19/2020  ? Degenerative joint disease (DJD) of lumbar spine 06/08/2020  ? Exercise-induced asthma 02/18/2017  ? Migraine without aura 06/19/2007  ? ? ?Allergies: Patient has no known allergies. ? ?Social History: ?Patient  reports that she has never smoked. She has never used smokeless tobacco. She reports that she does not drink alcohol and does not use drugs. ? ?Current Meds  ?Medication Sig  ? albuterol (VENTOLIN HFA) 108 (90 Base) MCG/ACT inhaler One puff 30 minutes prior to exercise  ? chlorthalidone (HYGROTON) 25 MG tablet Take 1 tablet (25 mg total) by mouth daily.  ? Multiple Vitamin (MULTIVITAMIN) tablet Take 1 tablet by mouth daily.  ? OVER THE COUNTER MEDICATION Juice Plus  ? rizatriptan (MAXALT) 5 MG tablet Take 1 tablet (5 mg total) by mouth as needed. May repeat in 2 hours if needed  ? ? ?Review of Systems: ?Cardiovascular: negative for chest pain, palpitations, leg swelling, orthopnea ?Respiratory: negative for SOB, wheezing or persistent cough ?Gastrointestinal: negative for abdominal pain ?Genitourinary: negative for dysuria or gross hematuria ? ?Objective  ?Vitals: BP (!) 150/78   Pulse 75   Temp 98 ?F (36.7 ?C)   Ht '5\' 4"'$  (1.626 m)   Wt 192 lb 9.6 oz (87.4 kg)   SpO2 96%   BMI 33.06 kg/m?  ?General: no acute distress  ?Psych:  Alert and oriented, normal mood and affect ?HEENT:  Normocephalic, atraumatic, supple neck  ?Cardiovascular:  RRR without murmur. no edema, there is bilateral anterior chest wall tenderness but does not reproduce  pain. ?Respiratory:  Good breath sounds bilaterally, CTAB with normal respiratory effort ?Nontender abdomen ?Skin:  Warm, no rashes ?Neurologic:   Mental status is normal ? ?EKG: Normal sinus rhythm, nonspecific T wave abnormality in inferior.  No LVH.  No Q waves.  No comparisons ?Commons side effects, risks, benefits, and alternatives for medications and treatment plan prescribed today were discussed, and the patient expressed understanding of the given instructions. Patient is instructed to call or message via MyChart if he/she has any questions or concerns regarding our treatment plan. No barriers to understanding were identified. We discussed Red Flag symptoms and signs  in detail. Patient expressed understanding regarding what to do in case of urgent or emergency type symptoms.  ?Medication list was reconciled, printed and provided to the patient in AVS. Patient instructions and summary information was reviewed with the patient as documented in the AVS. ?This note was prepared with assistance of Systems analyst. Occasional wrong-word or sound-a-like substitutions may have occurred due to the inherent limitations of voice recognition software ? ?This visit occurred during the SARS-CoV-2 public health emergency.  Safety protocols were in place, including screening questions prior to the visit, additional usage of staff PPE, and extensive cleaning of exam room while observing appropriate contact time as indicated for disinfecting solutions.  ?

## 2022-03-04 ENCOUNTER — Encounter: Payer: Self-pay | Admitting: Family Medicine

## 2022-03-12 ENCOUNTER — Ambulatory Visit (INDEPENDENT_AMBULATORY_CARE_PROVIDER_SITE_OTHER): Payer: BC Managed Care – PPO

## 2022-03-12 DIAGNOSIS — R072 Precordial pain: Secondary | ICD-10-CM

## 2022-03-12 DIAGNOSIS — E782 Mixed hyperlipidemia: Secondary | ICD-10-CM | POA: Diagnosis not present

## 2022-03-12 DIAGNOSIS — I10 Essential (primary) hypertension: Secondary | ICD-10-CM

## 2022-03-12 LAB — EXERCISE TOLERANCE TEST
Angina Index: 0
Duke Treadmill Score: 7
Estimated workload: 8.5
Exercise duration (min): 7 min
Exercise duration (sec): 0 s
MPHR: 165 {beats}/min
Peak HR: 160 {beats}/min
Percent HR: 96 %
RPE: 17
Rest HR: 85 {beats}/min
ST Depression (mm): 0 mm

## 2022-03-29 ENCOUNTER — Ambulatory Visit (INDEPENDENT_AMBULATORY_CARE_PROVIDER_SITE_OTHER): Payer: BC Managed Care – PPO | Admitting: Family Medicine

## 2022-03-29 ENCOUNTER — Encounter: Payer: Self-pay | Admitting: Family Medicine

## 2022-03-29 VITALS — BP 126/64 | HR 78 | Temp 98.7°F | Ht 64.0 in | Wt 193.8 lb

## 2022-03-29 DIAGNOSIS — R072 Precordial pain: Secondary | ICD-10-CM

## 2022-03-29 DIAGNOSIS — I10 Essential (primary) hypertension: Secondary | ICD-10-CM

## 2022-03-29 LAB — BASIC METABOLIC PANEL
BUN: 15 mg/dL (ref 6–23)
CO2: 33 mEq/L — ABNORMAL HIGH (ref 19–32)
Calcium: 9.7 mg/dL (ref 8.4–10.5)
Chloride: 99 mEq/L (ref 96–112)
Creatinine, Ser: 0.88 mg/dL (ref 0.40–1.20)
GFR: 74.1 mL/min (ref >60.00)
Glucose, Bld: 99 mg/dL (ref 70–99)
Potassium: 3.7 mEq/L (ref 3.5–5.1)
Sodium: 140 mEq/L (ref 135–145)

## 2022-03-29 NOTE — Patient Instructions (Signed)
Please return in 6 months for hypertension follow up.   If you have any questions or concerns, please don't hesitate to send me a message via MyChart or call the office at [phone]. Thank you for visiting with [place] today! It's our pleasure caring for you.

## 2022-03-29 NOTE — Progress Notes (Signed)
Subjective  CC:  Chief Complaint  Patient presents with   Hypertension    Ptr here to F/U with Bp    HPI: Tiffany Beltran is a 56 y.o. female who presents to the office today to address the problems listed above in the chief complaint. Hypertension substernal chest pain f/u: Patient is on chlorthalidone 25 mg daily.  Tolerating well now.  No longer feeling anxious about diagnosis or treatment plan.  Reviewed treadmill stress test findings.  No ischemia.  Positive hypertensive response to exercise.  Patient denies any further episodes of chest pain.  No lower extremity edema.  No dizziness.  No shortness of breath. Reviewed lipids.  Assessment  1. White coat syndrome with diagnosis of hypertension   2. Substernal chest pain      Plan   Hypertension f/u: BP control is well controlled.  Home blood pressures are well controlled as well.  She is tolerating medication.  Feeling better about her diagnosis, needing to take medications.  We will recheck function and potassium levels on chlorthalidone.  We will adjust medication to [facility] if hypokalemic.  Continue low-salt diet.  Continue healthy diet and weight loss.  Cholesterol levels are stable.  We will recheck at her annual physical.  Atherosclerotic risk score is 4.4% Substernal chest pain is resolved.  Likely anxiety related.  Reassured  Education regarding management of these chronic disease states was given. Management strategies discussed on successive visits include dietary and exercise recommendations, goals of achieving and maintaining IBW, and lifestyle modifications aiming for adequate sleep and minimizing stressors.   Follow up: 6 months to recheck blood pressure  Orders Placed This Encounter  Procedures   Basic metabolic panel   No orders of the defined types were placed in this encounter.     BP Readings from Last 3 Encounters:  03/29/22 126/64  02/22/22 (!) 150/78  02/18/22 (!) 168/94   Wt Readings from Last 3  Encounters:  03/29/22 193 lb 12.8 oz (87.9 kg)  02/22/22 192 lb 9.6 oz (87.4 kg)  02/18/22 196 lb 3.2 oz (89 kg)    Lab Results  Component Value Date   CHOL 206 (H) 11/12/2021   CHOL 212 (H) 10/19/2020   CHOL 210 (H) 08/11/2019   Lab Results  Component Value Date   HDL 60.20 11/12/2021   HDL 58 10/19/2020   HDL 54.50 08/11/2019   Lab Results  Component Value Date   LDLCALC 128 (H) 11/12/2021   LDLCALC 135 (H) 10/19/2020   LDLCALC 140 (H) 08/11/2019   Lab Results  Component Value Date   TRIG 90.0 11/12/2021   TRIG 90 10/19/2020   TRIG 76.0 08/11/2019   Lab Results  Component Value Date   CHOLHDL 3 11/12/2021   CHOLHDL 3.7 10/19/2020   CHOLHDL 4 08/11/2019   No results found for: LDLDIRECT Lab Results  Component Value Date   CREATININE 0.91 02/22/2022   BUN 16 02/22/2022   NA 139 02/22/2022   K 3.7 02/22/2022   CL 98 02/22/2022   CO2 33 (H) 02/22/2022    The 10-year ASCVD risk score (Arnett DK, et al., 2019) is: 4.4%   Values used to calculate the score:     Age: 66 years     Sex: Female     Is Non-Hispanic African American: Yes     Diabetic: No     Tobacco smoker: No     Systolic Blood Pressure: 528 mmHg     Is BP treated: Yes  HDL Cholesterol: 60.2 mg/dL     Total Cholesterol: 206 mg/dL  I reviewed the patients updated PMH, FH, and SocHx.    Patient Active Problem List   Diagnosis Date Noted   White coat syndrome with diagnosis of hypertension 04/20/2021    Priority: High   Mixed hyperlipidemia 02/22/2022   Family history of breast cancer in sister 10/19/2020   At high risk for breast cancer 10/19/2020   Benign essential microscopic hematuria 10/19/2020   Degenerative joint disease (DJD) of lumbar spine 06/08/2020   Exercise-induced asthma 02/18/2017   Migraine without aura 06/19/2007    Allergies: Patient has no known allergies.  Social History: Patient  reports that she has never smoked. She has never used smokeless tobacco. She  reports that she does not drink alcohol and does not use drugs.  Current Meds  Medication Sig   albuterol (VENTOLIN HFA) 108 (90 Base) MCG/ACT inhaler One puff 30 minutes prior to exercise   chlorthalidone (HYGROTON) 25 MG tablet Take 1 tablet (25 mg total) by mouth daily.   Multiple Vitamin (MULTIVITAMIN) tablet Take 1 tablet by mouth daily.   rizatriptan (MAXALT) 5 MG tablet Take 1 tablet (5 mg total) by mouth as needed. May repeat in 2 hours if needed    Review of Systems: Cardiovascular: negative for chest pain, palpitations, leg swelling, orthopnea Respiratory: negative for SOB, wheezing or persistent cough Gastrointestinal: negative for abdominal pain Genitourinary: negative for dysuria or gross hematuria  Objective  Vitals: BP 126/64   Pulse 78   Temp 98.7 F (37.1 C)   Ht '5\' 4"'$  (1.626 m)   Wt 193 lb 12.8 oz (87.9 kg)   SpO2 97%   BMI 33.27 kg/m  General: no acute distress  Cardiovascular:  RRR without murmur. no edema Respiratory:  Good breath sounds bilaterally, CTAB with normal respiratory effort  Commons side effects, risks, benefits, and alternatives for medications and treatment plan prescribed today were discussed, and the patient expressed understanding of the given instructions. Patient is instructed to call or message via MyChart if he/she has any questions or concerns regarding our treatment plan. No barriers to understanding were identified. We discussed Red Flag symptoms and signs in detail. Patient expressed understanding regarding what to do in case of urgent or emergency type symptoms.  Medication list was reconciled, printed and provided to the patient in AVS. Patient instructions and summary information was reviewed with the patient as documented in the AVS. This note was prepared with assistance of Dragon voice recognition software. Occasional wrong-word or sound-a-like substitutions may have occurred due to the inherent limitations of voice recognition  software  This visit occurred during the SARS-CoV-2 public health emergency.  Safety protocols were in place, including screening questions prior to the visit, additional usage of staff PPE, and extensive cleaning of exam room while observing appropriate contact time as indicated for disinfecting solutions.

## 2022-04-02 ENCOUNTER — Ambulatory Visit: Payer: BC Managed Care – PPO | Admitting: Family Medicine

## 2022-04-09 DIAGNOSIS — F4323 Adjustment disorder with mixed anxiety and depressed mood: Secondary | ICD-10-CM | POA: Diagnosis not present

## 2022-04-09 DIAGNOSIS — Z634 Disappearance and death of family member: Secondary | ICD-10-CM | POA: Diagnosis not present

## 2022-05-02 DIAGNOSIS — H40013 Open angle with borderline findings, low risk, bilateral: Secondary | ICD-10-CM | POA: Diagnosis not present

## 2022-05-09 DIAGNOSIS — F4323 Adjustment disorder with mixed anxiety and depressed mood: Secondary | ICD-10-CM | POA: Diagnosis not present

## 2022-05-09 DIAGNOSIS — F4320 Adjustment disorder, unspecified: Secondary | ICD-10-CM | POA: Diagnosis not present

## 2022-05-09 DIAGNOSIS — Z634 Disappearance and death of family member: Secondary | ICD-10-CM | POA: Diagnosis not present

## 2022-07-29 ENCOUNTER — Encounter: Payer: Self-pay | Admitting: *Deleted

## 2022-09-25 ENCOUNTER — Ambulatory Visit: Payer: BC Managed Care – PPO

## 2022-10-01 ENCOUNTER — Ambulatory Visit: Payer: BC Managed Care – PPO | Admitting: Family Medicine

## 2022-10-04 ENCOUNTER — Encounter: Payer: Self-pay | Admitting: Family Medicine

## 2022-10-04 ENCOUNTER — Ambulatory Visit (INDEPENDENT_AMBULATORY_CARE_PROVIDER_SITE_OTHER): Payer: BC Managed Care – PPO | Admitting: Family Medicine

## 2022-10-04 VITALS — BP 122/60 | HR 86 | Temp 98.3°F | Ht 64.0 in | Wt 203.0 lb

## 2022-10-04 DIAGNOSIS — Z23 Encounter for immunization: Secondary | ICD-10-CM | POA: Diagnosis not present

## 2022-10-04 DIAGNOSIS — I10 Essential (primary) hypertension: Secondary | ICD-10-CM

## 2022-10-04 NOTE — Progress Notes (Signed)
Subjective  CC:  Chief Complaint  Patient presents with   Hypertension    HPI: Tiffany Beltran is a 56 y.o. female who presents to the office today to address the problems listed above in the chief complaint. Hypertension f/u: Control is good . Pt reports she is doing well. taking medications as instructed, no medication side effects noted, no TIAs, no chest pain on exertion, no dyspnea on exertion, no swelling of ankles. Home readings are ideal avg 114-120s/60s-70s. She has brought her home log. She denies adverse effects from his BP medications. Compliance with medication is good.   Assessment  1. White coat syndrome with diagnosis of hypertension   2. Need for immunization against influenza      Plan   Hypertension f/u: BP control is well controlled. Continue chlorthalidone 25 daily, low sodium diet. Will recheck lytes and renal function in 3 mo at her cpe. Rec high potassium diet.  Flu shot updated today  Education regarding management of these chronic disease states was given. Management strategies discussed on successive visits include dietary and exercise recommendations, goals of achieving and maintaining IBW, and lifestyle modifications aiming for adequate sleep and minimizing stressors.   Follow up: 3 mo for cpe  Orders Placed This Encounter  Procedures   Flu Vaccine QUAD 49moIM (Fluarix, Fluzone & Alfiuria Quad PF)   No orders of the defined types were placed in this encounter.     BP Readings from Last 3 Encounters:  10/04/22 122/60  03/29/22 126/64  02/22/22 (!) 150/78   Wt Readings from Last 3 Encounters:  10/04/22 203 lb (92.1 kg)  03/29/22 193 lb 12.8 oz (87.9 kg)  02/22/22 192 lb 9.6 oz (87.4 kg)    Lab Results  Component Value Date   CHOL 206 (H) 11/12/2021   CHOL 212 (H) 10/19/2020   CHOL 210 (H) 08/11/2019   Lab Results  Component Value Date   HDL 60.20 11/12/2021   HDL 58 10/19/2020   HDL 54.50 08/11/2019   Lab Results  Component Value Date    LDLCALC 128 (H) 11/12/2021   LDLCALC 135 (H) 10/19/2020   LDLCALC 140 (H) 08/11/2019   Lab Results  Component Value Date   TRIG 90.0 11/12/2021   TRIG 90 10/19/2020   TRIG 76.0 08/11/2019   Lab Results  Component Value Date   CHOLHDL 3 11/12/2021   CHOLHDL 3.7 10/19/2020   CHOLHDL 4 08/11/2019   No results found for: "LDLDIRECT" Lab Results  Component Value Date   CREATININE 0.88 03/29/2022   BUN 15 03/29/2022   NA 140 03/29/2022   K 3.7 03/29/2022   CL 99 03/29/2022   CO2 33 (H) 03/29/2022    The 10-year ASCVD risk score (Arnett DK, et al., 2019) is: 3.9%   Values used to calculate the score:     Age: [age]years     Sex: Female     Is Non-Hispanic African American: Yes     Diabetic: No     Tobacco smoker: No     Systolic Blood Pressure: 1096mmHg     Is BP treated: Yes     HDL Cholesterol: 60.2 mg/dL     Total Cholesterol: 206 mg/dL  I reviewed the patients updated PMH, FH, and SocHx.    Patient Active Problem List   Diagnosis Date Noted   White coat syndrome with diagnosis of hypertension 04/20/2021    Priority: High   Mixed hyperlipidemia 02/22/2022   Family history of breast cancer  in sister 10/19/2020   At high risk for breast cancer 10/19/2020   Benign essential microscopic hematuria 10/19/2020   Degenerative joint disease (DJD) of lumbar spine 06/08/2020   Exercise-induced asthma 02/18/2017   Migraine without aura 06/19/2007    Allergies: Patient has no known allergies.  Social History: Patient  reports that she has never smoked. She has never used smokeless tobacco. She reports that she does not drink alcohol and does not use drugs.  Current Meds  Medication Sig   albuterol (VENTOLIN HFA) 108 (90 Base) MCG/ACT inhaler One puff 30 minutes prior to exercise   chlorthalidone (HYGROTON) 25 MG tablet Take 1 tablet (25 mg total) by mouth daily.   Multiple Vitamin (MULTIVITAMIN) tablet Take 1 tablet by mouth daily.   rizatriptan (MAXALT) 5 MG tablet  Take 1 tablet (5 mg total) by mouth as needed. May repeat in 2 hours if needed    Review of Systems: Cardiovascular: negative for chest pain, palpitations, leg swelling, orthopnea Respiratory: negative for SOB, wheezing or persistent cough Gastrointestinal: negative for abdominal pain Genitourinary: negative for dysuria or gross hematuria  Objective  Vitals: BP 122/60   Pulse 86   Temp 98.3 F (36.8 C)   Ht '5\' 4"'$  (1.626 m)   Wt 203 lb (92.1 kg)   SpO2 98%   BMI 34.84 kg/m  General: no acute distress  Psych:  Alert and oriented, normal mood and affect HEENT:  Normocephalic, atraumatic, supple neck  Cardiovascular:  RRR without murmur. no edema Respiratory:  Good breath sounds bilaterally, CTAB with normal respiratory effort Neurologic:   Mental status is normal Commons side effects, risks, benefits, and alternatives for medications and treatment plan prescribed today were discussed, and the patient expressed understanding of the given instructions. Patient is instructed to call or message via MyChart if he/she has any questions or concerns regarding our treatment plan. No barriers to understanding were identified. We discussed Red Flag symptoms and signs in detail. Patient expressed understanding regarding what to do in case of urgent or emergency type symptoms.  Medication list was reconciled, printed and provided to the patient in AVS. Patient instructions and summary information was reviewed with the patient as documented in the AVS. This note was prepared with assistance of Dragon voice recognition software. Occasional wrong-word or sound-a-like substitutions may have occurred due to the inherent limitation

## 2022-10-04 NOTE — Patient Instructions (Signed)
Please return in 3 months for your annual complete physical; please come fasting.   If you have any questions or concerns, please don't hesitate to send me a message via MyChart or call the office at [phone]. Thank you for visiting with us today! It's our pleasure caring for you.  

## 2022-10-11 ENCOUNTER — Encounter: Payer: Self-pay | Admitting: Family Medicine

## 2022-10-11 DIAGNOSIS — Z9189 Other specified personal risk factors, not elsewhere classified: Secondary | ICD-10-CM

## 2023-01-03 ENCOUNTER — Encounter: Payer: BC Managed Care – PPO | Admitting: Family Medicine

## 2023-01-07 ENCOUNTER — Other Ambulatory Visit: Payer: Self-pay | Admitting: Family Medicine

## 2023-01-07 DIAGNOSIS — Z1231 Encounter for screening mammogram for malignant neoplasm of breast: Secondary | ICD-10-CM

## 2023-01-08 ENCOUNTER — Encounter: Payer: Self-pay | Admitting: Family Medicine

## 2023-01-10 ENCOUNTER — Ambulatory Visit
Admission: RE | Admit: 2023-01-10 | Discharge: 2023-01-10 | Disposition: A | Discharge status: Home / Self Care | Payer: BC Managed Care – PPO | Source: Ambulatory Visit | Attending: Family Medicine | Admitting: Family Medicine

## 2023-01-10 ENCOUNTER — Other Ambulatory Visit: Payer: Self-pay | Admitting: Family Medicine

## 2023-01-10 DIAGNOSIS — Z9189 Other specified personal risk factors, not elsewhere classified: Secondary | ICD-10-CM

## 2023-01-10 DIAGNOSIS — N644 Mastodynia: Secondary | ICD-10-CM | POA: Diagnosis not present

## 2023-01-10 DIAGNOSIS — Z1231 Encounter for screening mammogram for malignant neoplasm of breast: Secondary | ICD-10-CM

## 2023-01-10 MED ORDER — GADOPICLENOL 0.5 MMOL/ML IV SOLN
9.0000 mL | Freq: Once | INTRAVENOUS | Status: AC | PRN
  Administered 2023-01-10: 9 mL via INTRAVENOUS

## 2023-01-24 ENCOUNTER — Encounter: Payer: Self-pay | Admitting: Family Medicine

## 2023-01-29 ENCOUNTER — Other Ambulatory Visit: Payer: Self-pay

## 2023-01-29 MED ORDER — CHLORTHALIDONE 25 MG PO TABS: 25.0000 mg | ORAL_TABLET | Freq: Every day | ORAL | 3 refills | Status: DC

## 2023-02-07 ENCOUNTER — Encounter: Payer: BC Managed Care – PPO | Admitting: Family Medicine

## 2023-02-09 ENCOUNTER — Other Ambulatory Visit: Payer: Self-pay | Admitting: Family Medicine

## 2023-02-18 ENCOUNTER — Encounter: Payer: Self-pay | Admitting: Family Medicine

## 2023-02-18 ENCOUNTER — Ambulatory Visit (INDEPENDENT_AMBULATORY_CARE_PROVIDER_SITE_OTHER): Payer: BC Managed Care – PPO | Admitting: Family Medicine

## 2023-02-18 VITALS — BP 118/70 | HR 74 | Temp 97.6°F | Ht 64.0 in | Wt 200.6 lb

## 2023-02-18 DIAGNOSIS — G43009 Migraine without aura, not intractable, without status migrainosus: Secondary | ICD-10-CM

## 2023-02-18 DIAGNOSIS — E782 Mixed hyperlipidemia: Secondary | ICD-10-CM

## 2023-02-18 DIAGNOSIS — I10 Essential (primary) hypertension: Secondary | ICD-10-CM

## 2023-02-18 DIAGNOSIS — J45990 Exercise induced bronchospasm: Secondary | ICD-10-CM

## 2023-02-18 DIAGNOSIS — Z9189 Other specified personal risk factors, not elsewhere classified: Secondary | ICD-10-CM

## 2023-02-18 DIAGNOSIS — Z0000 Encounter for general adult medical examination without abnormal findings: Secondary | ICD-10-CM | POA: Diagnosis not present

## 2023-02-18 LAB — COMPREHENSIVE METABOLIC PANEL
ALT: 11 U/L (ref 0–35)
AST: 16 U/L (ref 0–37)
Albumin: 4.3 g/dL (ref 3.5–5.2)
Alkaline Phosphatase: 61 U/L (ref 39–117)
BUN: 13 mg/dL (ref 6–23)
CO2: 34 mEq/L — ABNORMAL HIGH (ref 19–32)
Calcium: 9.7 mg/dL (ref 8.4–10.5)
Chloride: 98 mEq/L (ref 96–112)
Creatinine, Ser: 0.9 mg/dL (ref 0.40–1.20)
GFR: 71.68 mL/min (ref >60.00)
Glucose, Bld: 102 mg/dL — ABNORMAL HIGH (ref 70–99)
Potassium: 3.9 mEq/L (ref 3.5–5.1)
Sodium: 138 mEq/L (ref 135–145)
Total Bilirubin: 0.8 mg/dL (ref 0.2–1.2)
Total Protein: 7.1 g/dL (ref 6.0–8.3)

## 2023-02-18 LAB — CBC WITH DIFFERENTIAL/PLATELET
Basophils Absolute: 0 10*3/uL (ref 0.0–0.1)
Basophils Relative: 0.6 % (ref 0.0–3.0)
Eosinophils Absolute: 0.4 10*3/uL (ref 0.0–0.7)
Eosinophils Relative: 5.2 % — ABNORMAL HIGH (ref 0.0–5.0)
HCT: 36 % (ref 36.0–46.0)
Hemoglobin: 12.1 g/dL (ref 12.0–15.0)
Lymphocytes Relative: 46.7 % — ABNORMAL HIGH (ref 12.0–46.0)
Lymphs Abs: 3.2 10*3/uL (ref 0.7–4.0)
MCHC: 33.8 g/dL (ref 30.0–36.0)
MCV: 91.3 fl (ref 78.0–100.0)
Monocytes Absolute: 0.4 10*3/uL (ref 0.1–1.0)
Monocytes Relative: 6.1 % (ref 3.0–12.0)
Neutro Abs: 2.8 10*3/uL (ref 1.4–7.7)
Neutrophils Relative %: 41.4 % — ABNORMAL LOW (ref 43.0–77.0)
Platelets: 338 10*3/uL (ref 150.0–400.0)
RBC: 3.94 Mil/uL (ref 3.87–5.11)
RDW: 12.8 % (ref 11.5–15.5)
WBC: 6.9 10*3/uL (ref 4.0–10.5)

## 2023-02-18 LAB — LIPID PANEL
Cholesterol: 214 mg/dL — ABNORMAL HIGH (ref 0–200)
HDL: 62.4 mg/dL (ref >39.00)
LDL Cholesterol: 137 mg/dL — ABNORMAL HIGH (ref 0–99)
NonHDL: 151.32
Total CHOL/HDL Ratio: 3
Triglycerides: 73 mg/dL (ref 0.0–149.0)
VLDL: 14.6 mg/dL (ref 0.0–40.0)

## 2023-02-18 LAB — TSH: TSH: 1.37 u[IU]/mL (ref 0.35–5.50)

## 2023-02-18 NOTE — Progress Notes (Signed)
Subjective  Chief Complaint  Patient presents with   Annual Exam    Pt is currently fasting and mammo is scheduled for 02/21/2023    HPI: Tiffany Beltran is a 57 y.o. female who presents to [facility] [place] today for a Female Wellness Visit. She also has the concerns and/or needs as listed above in the chief complaint. These will be addressed in addition to the Health Maintenance Visit.   Wellness Visit: annual visit with health maintenance review and exam without Pap  HM: nl breast mri 01/2023 reviewed; has mammo scheduled next week: pap and crc screens are current. Doing great: exercising 3-5x/week: [employer] spin classes and strength training. Loves it and feels great.  Imms are current Chronic disease f/u and/or acute problem visit: (deemed necessary to be done in addition to the wellness visit): HTN: home readings are perfect: 120/70s are the highest. No cp or sob. No edema.  No need for albuterol in 1 year; even with exercise No migraines! HLD: working on [employer]; for recheck today. No statin yet.   Assessment  1. Annual physical exam   2. White coat syndrome with diagnosis of hypertension   3. At high risk for breast cancer   4. Mixed hyperlipidemia   5. Exercise-induced asthma   6. Migraine without aura and without status migrainosus, not intractable      Plan  Female Wellness Visit: Age appropriate Health Maintenance and Prevention measures were discussed with patient. Included topics are cancer screening recommendations, ways to keep healthy (see AVS) including dietary and exercise recommendations, regular eye and dental care, use of seat belts, and avoidance of moderate alcohol use and tobacco use. Will postpone mammo until September so she will have q 6 months screens.  BMI: discussed patient's BMI and encouraged positive lifestyle modifications to help get to or maintain a target BMI. HM needs and immunizations were addressed and ordered. See below for  orders. See HM and immunization section for updates. Routine labs and screening tests ordered including cmp, cbc and lipids where appropriate. Discussed recommendations regarding Vit D and calcium supplementation (see AVS)  Chronic disease management visit and/or acute problem visit: HTN is perfectly well controlled. Continue home monitoring. Continue chlorthalidone 25 daily. Continue exercise.  Migraines and EIA are well controlled with rare medication need; have albuterol and maxalt on hand if needed. (Doses below) HLD: recheck fasting levels today and offer statin if indicated.   Outpatient Encounter Medications as of 02/18/2023  Medication Sig Note   albuterol (VENTOLIN HFA) 108 (90 Base) MCG/ACT inhaler One puff 30 minutes prior to exercise    chlorthalidone (HYGROTON) 25 MG tablet Take 1 tablet (25 mg total) by mouth daily.    Multiple Vitamin (MULTIVITAMIN) tablet Take 1 tablet by mouth daily.    rizatriptan (MAXALT) 5 MG tablet Take 1 tablet (5 mg total) by mouth as needed. May repeat in 2 hours if needed 03/19/2017: Last use in 2017   No facility-administered encounter medications on file as of 02/18/2023.    Follow up: 12 mo for cpe  Orders Placed This Encounter  Procedures   CBC with Differential/Platelet   Comprehensive metabolic panel   Lipid panel   TSH   No orders of the defined types were placed in this encounter.     Body mass index is 34.43 kg/m. Wt Readings from Last 3 Encounters:  02/18/23 200 lb 9.6 oz (91 kg)  10/04/22 203 lb (92.1 kg)  03/29/22 193 lb  12.8 oz (87.9 kg)     Patient Active Problem List   Diagnosis Date Noted   Mixed hyperlipidemia 02/22/2022    Priority: High   White coat syndrome with diagnosis of hypertension 04/20/2021    Priority: High    Neg exercise treadmill test 03/2022; + hypertensive response to exercise    At high risk for breast cancer 10/19/2020    Priority: High   Family history of breast cancer in sister 10/19/2020     Priority: Medium    Degenerative joint disease (DJD) of lumbar spine 06/08/2020    Priority: Medium     Xray 06/2020    Exercise-induced asthma 02/18/2017    Priority: Medium    Migraine without aura 06/19/2007    Priority: Medium    Benign essential microscopic hematuria 10/19/2020    Priority: Low    2005 eval by urology. negative    Health Maintenance  Topic Date Due   COVID-19 Vaccine (4 - 2023-24 season) 03/06/2023 (Originally 07/05/2022)   INFLUENZA VACCINE  06/05/2023   MAMMOGRAM  01/26/2024   PAP SMEAR-Modifier  08/10/2024   DTaP/Tdap/Td (4 - Td or Tdap) 11/15/2024   COLONOSCOPY (Pts 45-40yrs Insurance coverage will need to be confirmed)  03/20/2027   Hepatitis C Screening  Completed   HIV Screening  Completed   Zoster Vaccines- Shingrix  Completed   HPV VACCINES  Aged Out   Immunization History  Administered Date(s) Administered   DT (Pediatric) 07/20/2003   Influenza Inj Mdck Quad Pf 08/21/2017   Influenza,inj,Quad PF,6+ Mos 08/28/2018, 08/11/2019, 10/19/2020, 10/02/2021, 10/04/2022   Influenza-Unspecified 11/14/2017   Moderna Sars-Covid-2 Vaccination 02/17/2020, 03/18/2020, 09/20/2020   PNEUMOCOCCAL CONJUGATE-20 11/12/2021   Td 11/05/2003   Tdap 11/15/2014   Zoster Recombinat (Shingrix) 12/07/2021, 02/18/2022   We updated and reviewed the patient's past history in detail and it is documented below. Allergies: Patient has No Known Allergies. Past Medical History Patient  has a past medical history of Allergy, Anemia, Arthritis, Asthma, Headache(784.0), Migraines, and Undiagnosed cardiac murmurs. Past Surgical History Patient  has no past surgical history on file. Family History: Patient family history includes Breast cancer (age of onset: 58) in her sister; Glaucoma in her mother; Hypertension in an other family member. Social History:  Patient  reports that she has never smoked. She has never used smokeless tobacco. She reports that she does not drink alcohol  and does not use drugs.  Review of Systems: Constitutional: negative for fever or malaise Ophthalmic: negative for photophobia, double vision or loss of vision Cardiovascular: negative for chest pain, dyspnea on exertion, or new LE swelling Respiratory: negative for SOB or persistent cough Gastrointestinal: negative for abdominal pain, change in bowel habits or melena Genitourinary: negative for dysuria or gross hematuria, no abnormal uterine bleeding or disharge Musculoskeletal: negative for new gait disturbance or muscular weakness Integumentary: negative for new or persistent rashes, no breast lumps Neurological: negative for TIA or stroke symptoms Psychiatric: negative for SI or delusions Allergic/Immunologic: negative for hives  Patient Care Team    Relationship Specialty Notifications Start End  Willow Ora, MD PCP - General Family Medicine  08/11/19     Objective  Vitals: BP 118/70 Comment: by consistent home readings  Pulse 74   Temp 97.6 F (36.4 C)   Ht 5\' 4"  (1.626 m)   Wt 200 lb 9.6 oz (91 kg)   SpO2 98%   BMI 34.43 kg/m  General:  Well developed, well nourished, no acute distress  Psych:  Alert  and orientedx3,normal mood and affect HEENT:  Normocephalic, atraumatic, non-icteric sclera,  supple neck without adenopathy, mass or thyromegaly Cardiovascular:  Normal S1, S2, RRR without gallop, rub or murmur Respiratory:  Good breath sounds bilaterally, CTAB with normal respiratory effort Gastrointestinal: normal bowel sounds, soft, non-tender, no noted masses. No HSM MSK: extremities without edema, joints without erythema or swelling Neurologic:    Mental status is normal.  Gross motor and sensory exams are normal.  No tremor  Commons side effects, risks, benefits, and alternatives for medications and treatment plan prescribed today were discussed, and the patient expressed understanding of the given instructions. Patient is instructed to call or message via MyChart  if he/she has any questions or concerns regarding our treatment plan. No barriers to understanding were identified. We discussed Red Flag symptoms and signs in detail. Patient expressed understanding regarding what to do in case of urgent or emergency type symptoms.  Medication list was reconciled, printed and provided to the patient in AVS. Patient instructions and summary information was reviewed with the patient as documented in the AVS. This note was prepared with assistance of Dragon voice recognition software. Occasional wrong-word or sound-a-like substitutions may have occurred due to the inherent limitations of voice recognition software

## 2023-02-18 NOTE — Patient Instructions (Signed)

## 2023-02-21 DIAGNOSIS — Z1231 Encounter for screening mammogram for malignant neoplasm of breast: Secondary | ICD-10-CM

## 2023-06-18 DIAGNOSIS — L723 Sebaceous cyst: Secondary | ICD-10-CM | POA: Diagnosis not present

## 2023-06-18 DIAGNOSIS — R208 Other disturbances of skin sensation: Secondary | ICD-10-CM | POA: Diagnosis not present

## 2023-06-18 DIAGNOSIS — L728 Other follicular cysts of the skin and subcutaneous tissue: Secondary | ICD-10-CM | POA: Diagnosis not present

## 2023-07-16 ENCOUNTER — Encounter: Payer: Self-pay | Admitting: Family Medicine

## 2023-07-16 ENCOUNTER — Ambulatory Visit
Admission: RE | Admit: 2023-07-16 | Discharge: 2023-07-16 | Disposition: A | Discharge status: Home / Self Care | Payer: BC Managed Care – PPO | Source: Ambulatory Visit | Attending: Family Medicine | Admitting: Family Medicine

## 2023-07-16 ENCOUNTER — Other Ambulatory Visit: Payer: Self-pay

## 2023-07-16 DIAGNOSIS — N644 Mastodynia: Secondary | ICD-10-CM

## 2023-07-16 DIAGNOSIS — Z1231 Encounter for screening mammogram for malignant neoplasm of breast: Secondary | ICD-10-CM

## 2023-07-16 NOTE — Telephone Encounter (Signed)
diagnostic mammogram has been placed

## 2023-07-22 ENCOUNTER — Other Ambulatory Visit: Payer: Self-pay | Admitting: Family Medicine

## 2023-07-22 DIAGNOSIS — N644 Mastodynia: Secondary | ICD-10-CM

## 2023-07-28 ENCOUNTER — Ambulatory Visit
Admission: RE | Admit: 2023-07-28 | Discharge: 2023-07-28 | Disposition: A | Discharge status: Home / Self Care | Payer: BC Managed Care – PPO | Source: Ambulatory Visit | Attending: Family Medicine | Admitting: Family Medicine

## 2023-07-28 ENCOUNTER — Ambulatory Visit: Payer: BC Managed Care – PPO

## 2023-07-28 DIAGNOSIS — N644 Mastodynia: Secondary | ICD-10-CM

## 2023-07-28 DIAGNOSIS — R079 Chest pain, unspecified: Secondary | ICD-10-CM | POA: Diagnosis not present

## 2023-10-08 DIAGNOSIS — H40013 Open angle with borderline findings, low risk, bilateral: Secondary | ICD-10-CM | POA: Diagnosis not present

## 2024-01-14 DIAGNOSIS — H40013 Open angle with borderline findings, low risk, bilateral: Secondary | ICD-10-CM | POA: Diagnosis not present

## 2024-01-27 ENCOUNTER — Other Ambulatory Visit: Payer: Self-pay | Admitting: Family Medicine

## 2024-02-12 ENCOUNTER — Encounter: Payer: Self-pay | Admitting: Family Medicine

## 2024-02-12 ENCOUNTER — Other Ambulatory Visit: Payer: Self-pay

## 2024-02-13 ENCOUNTER — Other Ambulatory Visit: Payer: Self-pay

## 2024-02-13 DIAGNOSIS — Z1231 Encounter for screening mammogram for malignant neoplasm of breast: Secondary | ICD-10-CM

## 2024-03-02 ENCOUNTER — Ambulatory Visit: Payer: BC Managed Care – PPO | Admitting: Family Medicine

## 2024-03-02 ENCOUNTER — Encounter: Payer: Self-pay | Admitting: Family Medicine

## 2024-03-02 VITALS — BP 125/73 | HR 82 | Temp 97.9°F | Ht 64.0 in | Wt 202.2 lb

## 2024-03-02 DIAGNOSIS — M5432 Sciatica, left side: Secondary | ICD-10-CM | POA: Diagnosis not present

## 2024-03-02 DIAGNOSIS — I10 Essential (primary) hypertension: Secondary | ICD-10-CM

## 2024-03-02 DIAGNOSIS — E782 Mixed hyperlipidemia: Secondary | ICD-10-CM | POA: Diagnosis not present

## 2024-03-02 DIAGNOSIS — Z9189 Other specified personal risk factors, not elsewhere classified: Secondary | ICD-10-CM

## 2024-03-02 DIAGNOSIS — Z0001 Encounter for general adult medical examination with abnormal findings: Secondary | ICD-10-CM | POA: Diagnosis not present

## 2024-03-02 DIAGNOSIS — N952 Postmenopausal atrophic vaginitis: Secondary | ICD-10-CM | POA: Diagnosis not present

## 2024-03-02 DIAGNOSIS — Z124 Encounter for screening for malignant neoplasm of cervix: Secondary | ICD-10-CM

## 2024-03-02 LAB — CBC WITH DIFFERENTIAL/PLATELET
Basophils Absolute: 0 10*3/uL (ref 0.0–0.1)
Basophils Relative: 0.7 % (ref 0.0–3.0)
Eosinophils Absolute: 0.4 10*3/uL (ref 0.0–0.7)
Eosinophils Relative: 5.2 % — ABNORMAL HIGH (ref 0.0–5.0)
HCT: 36.4 % (ref 36.0–46.0)
Hemoglobin: 12.2 g/dL (ref 12.0–15.0)
Lymphocytes Relative: 39.9 % (ref 12.0–46.0)
Lymphs Abs: 2.8 10*3/uL (ref 0.7–4.0)
MCHC: 33.5 g/dL (ref 30.0–36.0)
MCV: 91.6 fl (ref 78.0–100.0)
Monocytes Absolute: 0.3 10*3/uL (ref 0.1–1.0)
Monocytes Relative: 4.4 % (ref 3.0–12.0)
Neutro Abs: 3.5 10*3/uL (ref 1.4–7.7)
Neutrophils Relative %: 49.8 % (ref 43.0–77.0)
Platelets: 361 10*3/uL (ref 150.0–400.0)
RBC: 3.98 Mil/uL (ref 3.87–5.11)
RDW: 13.2 % (ref 11.5–15.5)
WBC: 7.1 10*3/uL (ref 4.0–10.5)

## 2024-03-02 LAB — COMPREHENSIVE METABOLIC PANEL WITH GFR
ALT: 13 U/L (ref 0–35)
AST: 17 U/L (ref 0–37)
Albumin: 4.3 g/dL (ref 3.5–5.2)
Alkaline Phosphatase: 62 U/L (ref 39–117)
BUN: 13 mg/dL (ref 6–23)
CO2: 33 meq/L — ABNORMAL HIGH (ref 19–32)
Calcium: 9.7 mg/dL (ref 8.4–10.5)
Chloride: 99 meq/L (ref 96–112)
Creatinine, Ser: 0.9 mg/dL (ref 0.40–1.20)
GFR: 71.16 mL/min (ref >60.00)
Glucose, Bld: 95 mg/dL (ref 70–99)
Potassium: 3.3 meq/L — ABNORMAL LOW (ref 3.5–5.1)
Sodium: 140 meq/L (ref 135–145)
Total Bilirubin: 0.7 mg/dL (ref 0.2–1.2)
Total Protein: 7.5 g/dL (ref 6.0–8.3)

## 2024-03-02 LAB — LIPID PANEL
Cholesterol: 203 mg/dL — ABNORMAL HIGH (ref 0–200)
HDL: 64.8 mg/dL
LDL Cholesterol: 125 mg/dL — ABNORMAL HIGH (ref 0–99)
NonHDL: 138
Total CHOL/HDL Ratio: 3
Triglycerides: 67 mg/dL (ref 0.0–149.0)
VLDL: 13.4 mg/dL (ref 0.0–40.0)

## 2024-03-02 LAB — TSH: TSH: 1.81 u[IU]/mL (ref 0.35–5.50)

## 2024-03-02 LAB — VITAMIN D 25 HYDROXY (VIT D DEFICIENCY, FRACTURES): VITD: 37.17 ng/mL (ref 30.00–100.00)

## 2024-03-02 MED ORDER — ESTRADIOL 0.1 MG/GM VA CREA: 1.0000 | TOPICAL_CREAM | VAGINAL | 12 refills | Status: AC

## 2024-03-02 NOTE — Patient Instructions (Addendum)
 Please return in 4 weeks for your pap smear and in 12 months for your annual complete physical; please come fasting.   I will release your lab results to you on your MyChart account with further instructions. You may see the results before I do, but when I review them I will send you a message with my report or have my assistant call you if things need to be discussed. Please reply to my message with any questions. Thank you!   If you have any questions or concerns, please don't hesitate to send me a message via MyChart or call the office at [phone]. Thank you for visiting with us  today! It's our pleasure caring for you.   VISIT SUMMARY:  Today, we discussed your nighttime leg pain, menopausal symptoms, and overall wellness. We reviewed your exercise routine and recent changes in your vitamin regimen. We also addressed your sciatica and planned for follow-up tests and treatments.  YOUR PLAN:  -WELLNESS VISIT: This visit was a routine check-up to monitor your overall health, including menopausal symptoms and difficulty with Pap smear due to vaginal dryness due to menopause. We will perform a Pap smear at your follow-up visit in four weeks. Blood work has been ordered to check your potassium and cholesterol levels, and a breast MRI has been scheduled. You have been prescribed vaginal estrogen cream to be used three to five times per week for several weeks.  -MENOPAUSAL SYMPTOMS: Menopause can cause symptoms like hot flashes and vaginal dryness. You prefer not to use systemic hormone replacement therapy, so we recommend continuing your current exercise regimen and using the prescribed vaginal estrogen cream as directed and returning for your pap smear.  -SCIATICA: Sciatica is pain that radiates along the path of the sciatic nerve, which runs down one or both legs from the lower back. Your intermittent nighttime leg pain is likely related to this condition. We recommend taking ibuprofen twice daily  with food for 7-10 days and reporting if your symptoms do not improve.  Your blood pressure is well controlled.   INSTRUCTIONS:  Please follow up in four weeks for a Pap smear. In the meantime, take ibuprofen twice daily with food for 7-10 days for your leg pain. Use the vaginal estrogen cream as prescribed, and continue your current exercise regimen. Report any lack of improvement in your symptoms.

## 2024-03-02 NOTE — Progress Notes (Signed)
 Subjective  Chief Complaint  Patient presents with   Annual Exam    HPI: Tiffany Beltran is a 58 y.o. female who presents to [facility] today for a Female Wellness Visit. She also has the concerns and/or needs as listed above in the chief complaint. These will be addressed in addition to the Health Maintenance Visit.   Wellness Visit: annual visit with health maintenance review and exam  HM: mammo current. Needs breast MRI for high risk status. Pap due. CRC screen current. Eye exam current. Healthy lifestyle. Imms up to date Chronic disease f/u and/or acute problem visit: (deemed necessary to be done in addition to the wellness visit): Discussed the use of AI scribe software for clinical note transcription with the patient, who gave verbal consent to proceed.  History of Present Illness   Tiffany Beltran is a 58 year old female who presents with nighttime leg pain.  She has been experiencing nighttime leg pain for the past few weeks. The pain begins as a cramp and transitions into an ache, primarily affecting her leg and sometimes associated with her knee. She has a history of knee issues and wears a knee brace during body pump sessions. Wearing better shoes seemed to alleviate the pain one night. She exercises five to six times a week, sometimes twice a day, engaging in activities like body pump, yoga, and Zumba. Ibuprofen has been used a couple of times, providing relief. Pain shoots down backside of left leg. No weakness.  Does report a history of sciatica on the left about a month ago.  Symptoms are mild a similar.  No calf pain.  No lower extremity swelling.  She has a history of sciatica, which occurred around her birthday last month, and she wonders if the current leg pain could be related. There is no swelling in the leg except for some in the knee, which she attributes to exercises like lunges and squats. The pain does not occur during the day and is not reproducible  with activity.  She switched her vitamin regimen from D3 to D3 with K2 about four weeks ago but is unsure if this change is related to her symptoms.  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 No issues with mood, cognition, vaginal dryness, pain with intercourse, or urinary habits.  Postmenopausal symptoms are mild and manageable  She is actively working on her fitness, lifting weights to increase muscle mass and improve her posture. She works from home and finds it important to get out of the house to maintain her mental well-being.     She takes chlorthalidone  for hypertension which has been very well-controlled.  Well-tolerated.  She is fasting for recheck today.  Borderline hyperlipidemia not yet on medications.   Assessment  1. Encounter for well adult exam with abnormal findings   2. White coat syndrome with diagnosis of hypertension   3. Mixed hyperlipidemia   4. At high risk for breast cancer   5. Left sided sciatica   6. Vaginal atrophy      Plan  Female Wellness Visit: Age appropriate Health Maintenance and Prevention measures were discussed with patient. Included topics are cancer screening recommendations, ways to keep healthy (see AVS) including dietary and exercise recommendations, regular eye and dental care, use of seat belts, and avoidance of moderate alcohol use and tobacco use.  Attempted to obtain Pap smear today but due to vaginal atrophy, the exam was too  painful.  Vaginal mucosa was atrophic.  High risk breast cancer, due breast MRI.  Ordered today.  Patient to get scheduled.  Colon cancer screening current BMI: discussed patient's BMI and encouraged positive lifestyle modifications to help get to or maintain a target BMI. HM needs and immunizations were addressed and ordered. See below for orders. See HM and immunization section for updates. Routine labs and screening tests ordered including cmp, cbc and  lipids where appropriate. Discussed recommendations regarding Vit D and calcium supplementation (see AVS)  Chronic disease management visit and/or acute problem visit: Assessment and Plan    Wellness Visit Routine wellness visit with menopausal symptoms and difficulty with Pap smear due to vaginal dryness. - Perform Pap smear at follow-up visit in four weeks.  Will treat vaginal atrophy first to try to make exam more comfortable.  Patient agrees - Order blood work including potassium and cholesterol levels. - Order breast MRI. - Prescribe vaginal estrogen cream to be used three to five times per week for several weeks.  High risk for breast cancer, will use local and short course of therapy.  Patient understands risk versus benefits  Menopausal symptoms Experiencing hot flashes and vaginal dryness. Prefers not to use systemic hormone replacement therapy. Engaging in weight lifting to alleviate symptoms. - Continue current exercise regimen. - Use vaginal estrogen cream as prescribed.  Sciatica Intermittent nocturnal leg pain relieved by ibuprofen. Recent changes in vitamin D3 supplement and footwear unlikely to be the cause. - Take ibuprofen twice daily with food for 7-10 days. - Report if symptoms do not improve.   Hypertension is well-controlled.  Check renal function electrolytes.  Continue chlorthalidone  daily Check lipids. Monitor vitamin D  levels on daily supplements.      Follow up: 4 weeks for pap smear  Orders Placed This Encounter  Procedures   MR BREAST W & WO CM SCREENING (GI)   CBC with Differential/Platelet   Comprehensive metabolic panel with GFR   Lipid panel   TSH   VITAMIN D  25 Hydroxy (Vit-D Deficiency, Fractures)   Meds ordered this encounter  Medications   estradiol (ESTRACE VAGINAL) 0.1 MG/GM vaginal cream    Sig: Place 1 Applicatorful vaginally 3 (three) times a week.    Dispense:  42.5 g    Refill:  12      Body mass index is 34.71 kg/m. Wt  Readings from Last 3 Encounters:  03/02/24 202 lb 3.2 oz (91.7 kg)  02/18/23 200 lb 9.6 oz (91 kg)  10/04/22 203 lb (92.1 kg)     Patient Active Problem List   Diagnosis Date Noted Date Diagnosed   Mixed hyperlipidemia 02/22/2022     Priority: High   White coat syndrome with diagnosis of hypertension 04/20/2021     Priority: High    Neg exercise treadmill test 03/2022; + hypertensive response to exercise    At high risk for breast cancer 10/19/2020     Priority: High   Family history of breast cancer in sister 10/19/2020     Priority: Medium    Degenerative joint disease (DJD) of lumbar spine 06/08/2020     Priority: Medium     Xray 06/2020    Exercise-induced asthma 02/18/2017     Priority: Medium    Migraine without aura 06/19/2007     Priority: Medium    Benign essential microscopic hematuria 10/19/2020     Priority: Low    2005 eval by urology. negative    Health Maintenance  Topic Date Due  COVID-19 Vaccine (4 - 2024-25 season) 03/18/2024 (Originally 07/06/2023)   INFLUENZA VACCINE  06/04/2024   MAMMOGRAM  07/27/2024   Cervical Cancer Screening (HPV/Pap Cotest)  08/10/2024   DTaP/Tdap/Td (4 - Td or Tdap) 11/15/2024   Colonoscopy  03/20/2027   Pneumococcal Vaccine 63-60 Years old  Completed   Hepatitis C Screening  Completed   HIV Screening  Completed   Zoster Vaccines- Shingrix   Completed   HPV VACCINES  Aged Out   Meningococcal B Vaccine  Aged Out   Immunization History  Administered Date(s) Administered   DT (Pediatric) 07/20/2003   Influenza Inj Mdck Quad Pf 08/21/2017   Influenza,inj,Quad PF,6+ Mos 08/28/2018, 08/11/2019, 10/19/2020, 10/02/2021, 10/04/2022   Influenza-Unspecified 11/14/2017   Moderna Sars-Covid-2 Vaccination 02/17/2020, 03/18/2020, 09/20/2020   PNEUMOCOCCAL CONJUGATE-20 11/12/2021   Td 11/05/2003   Tdap 11/15/2014   Zoster Recombinant(Shingrix ) 12/07/2021, 02/18/2022   We updated and reviewed the patient's past history in detail and it  is documented below. Allergies: Patient has no known allergies. Past Medical History Patient  has a past medical history of Allergy, Anemia, Arthritis, Asthma, Headache(784.0), Hypertension (2023), Migraines, and Undiagnosed cardiac murmurs. Past Surgical History Patient  has no past surgical history on file. Family History: Patient family history includes Arthritis in her mother; Breast cancer (age of onset: 27) in her sister; Cancer in her sister; Diabetes in her maternal grandfather; Glaucoma in her mother; Heart disease in her mother; Hypertension in her mother, sister, and another family member. Social History:  Patient  reports that she has never smoked. She has never used smokeless tobacco. She reports that she does not drink alcohol and does not use drugs.  Review of Systems: Constitutional: negative for fever or malaise Ophthalmic: negative for photophobia, double vision or loss of vision Cardiovascular: negative for chest pain, dyspnea on exertion, or new LE swelling Respiratory: negative for SOB or persistent cough Gastrointestinal: negative for abdominal pain, change in bowel habits or melena Genitourinary: negative for dysuria or gross hematuria, no abnormal uterine bleeding or disharge Musculoskeletal: negative for new gait disturbance or muscular weakness Integumentary: negative for new or persistent rashes, no breast lumps Neurological: negative for TIA or stroke symptoms Psychiatric: negative for SI or delusions Allergic/Immunologic: negative for hives  Patient Care Team    Relationship Specialty Notifications Start End  Luevenia Saha, MD PCP - General Family Medicine  08/11/19   Sergio Dandy, MD Consulting Physician Gastroenterology  03/02/24     Objective  Vitals: BP 125/73   Pulse 82   Temp 97.9 F (36.6 C)   Ht 5\' 4"  (1.626 m)   Wt 202 lb 3.2 oz (91.7 kg)   SpO2 96%   BMI 34.71 kg/m  General:  Well developed, well nourished, no acute distress   Psych:  Alert and orientedx3,normal mood and affect HEENT:  Normocephalic, atraumatic, non-icteric sclera,  supple neck without adenopathy, mass or thyromegaly Cardiovascular:  Normal S1, S2, RRR without gallop, rub or murmur Respiratory:  Good breath sounds bilaterally, CTAB with normal respiratory effort Gastrointestinal: normal bowel sounds, soft, non-tender, no noted masses. No HSM MSK: extremities without edema, joints without erythema or swelling Neurologic:    Mental status is normal.  Gross motor and sensory exams are normal.  No tremor Vaginal exam: Painful, vaginal atrophy present.  Bimanual deferred  Commons side effects, risks, benefits, and alternatives for medications and treatment plan prescribed today were discussed, and the patient expressed understanding of the given instructions. Patient is instructed to call or message  via MyChart if he/she has any questions or concerns regarding our treatment plan. No barriers to understanding were identified. We discussed Red Flag symptoms and signs in detail. Patient expressed understanding regarding what to do in case of urgent or emergency type symptoms.  Medication list was reconciled, printed and provided to the patient in AVS. Patient instructions and summary information was reviewed with the patient as documented in the AVS. This note was prepared with assistance of Dragon voice recognition software. Occasional wrong-word or sound-a-like substitutions may have occurred due to the inherent limitations of voice recognition software

## 2024-03-10 LAB — CYTOLOGY - PAP
Adequacy: ABNORMAL
Comment: NEGATIVE

## 2024-03-13 ENCOUNTER — Ambulatory Visit
Admission: RE | Admit: 2024-03-13 | Discharge: 2024-03-13 | Disposition: A | Discharge status: Home / Self Care | Source: Ambulatory Visit | Attending: Family Medicine | Admitting: Family Medicine

## 2024-03-13 DIAGNOSIS — Z9189 Other specified personal risk factors, not elsewhere classified: Secondary | ICD-10-CM

## 2024-03-13 DIAGNOSIS — Z803 Family history of malignant neoplasm of breast: Secondary | ICD-10-CM | POA: Diagnosis not present

## 2024-03-13 DIAGNOSIS — Z1239 Encounter for other screening for malignant neoplasm of breast: Secondary | ICD-10-CM | POA: Diagnosis not present

## 2024-03-13 MED ORDER — GADOPICLENOL 0.5 MMOL/ML IV SOLN
9.0000 mL | Freq: Once | INTRAVENOUS | Status: AC | PRN
  Administered 2024-03-13: 9 mL via INTRAVENOUS

## 2024-03-15 DIAGNOSIS — Z124 Encounter for screening for malignant neoplasm of cervix: Secondary | ICD-10-CM | POA: Diagnosis not present

## 2024-03-15 DIAGNOSIS — Z803 Family history of malignant neoplasm of breast: Secondary | ICD-10-CM | POA: Diagnosis not present

## 2024-03-15 DIAGNOSIS — Z8041 Family history of malignant neoplasm of ovary: Secondary | ICD-10-CM | POA: Diagnosis not present

## 2024-03-15 DIAGNOSIS — Z01419 Encounter for gynecological examination (general) (routine) without abnormal findings: Secondary | ICD-10-CM | POA: Diagnosis not present

## 2024-03-17 ENCOUNTER — Ambulatory Visit: Payer: Self-pay | Admitting: Family Medicine

## 2024-03-17 MED ORDER — POTASSIUM CHLORIDE CRYS ER 10 MEQ PO TBCR
10.0000 meq | EXTENDED_RELEASE_TABLET | Freq: Every day | ORAL | 3 refills | Status: AC
Start: 2024-03-17 — End: ?

## 2024-03-17 NOTE — Progress Notes (Signed)
 See mychart note The 10-year ASCVD risk score (Arnett DK, et al., 2019) is: 4.6%   Values used to calculate the score:     Age: 58 years     Sex: Female     Is Non-Hispanic African American: Yes     Diabetic: No     Tobacco smoker: No     Systolic Blood Pressure: 125 mmHg     Is BP treated: Yes     HDL Cholesterol: 64.8 mg/dL     Total Cholesterol: 203 mg/dL

## 2024-03-30 ENCOUNTER — Ambulatory Visit: Admitting: Family Medicine

## 2024-06-07 ENCOUNTER — Encounter: Payer: Self-pay | Admitting: Family Medicine

## 2024-09-08 ENCOUNTER — Ambulatory Visit
Admission: RE | Admit: 2024-09-08 | Discharge: 2024-09-08 | Disposition: A | Source: Ambulatory Visit | Attending: Family Medicine | Admitting: Family Medicine

## 2024-09-08 DIAGNOSIS — Z1231 Encounter for screening mammogram for malignant neoplasm of breast: Secondary | ICD-10-CM | POA: Diagnosis not present

## 2025-03-03 ENCOUNTER — Encounter: Admitting: Family Medicine
